# Patient Record
Sex: Female | Born: 1995 | Race: Black or African American | Hispanic: No | Marital: Married | Smoking: Current some day smoker
Health system: Southern US, Community
[De-identification: ages and names within clinical notes are randomized; demographics above are authoritative.]

## PROBLEM LIST (undated history)

## (undated) ENCOUNTER — Ambulatory Visit: Admission: EM | Payer: 59

## (undated) DIAGNOSIS — D649 Anemia, unspecified: Secondary | ICD-10-CM

---
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
## 2013-02-27 IMAGING — CR DG FEMUR 2V*L*
1 series · 4 of 4 positions shown · non-contrast
Comparison: none

REASON FOR EXAM: pain Dr. Nhi  at [facility] fax [phone]
COMMENTS:  Dr. [REDACTED] [phone]

PROCEDURE:     DXR - DXR FEMUR LEFT  - July 28, 2011  [DATE]
RESULT:
No acute bony or joint abnormality. There is no evidence of fracture.

[Series 1: t femur proximal ap left · 0.14mm/px · 4 of 4 slices shown]
[im 1/4]
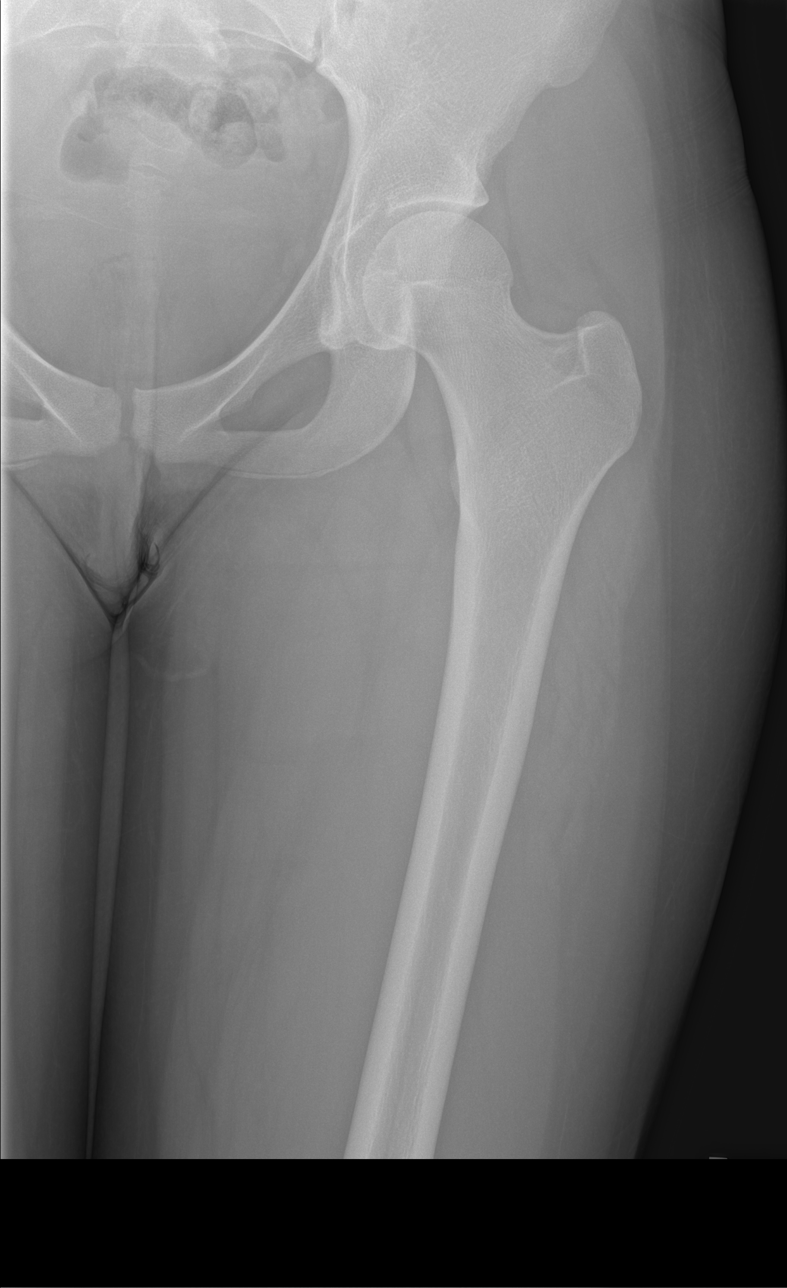
[im 2/4]
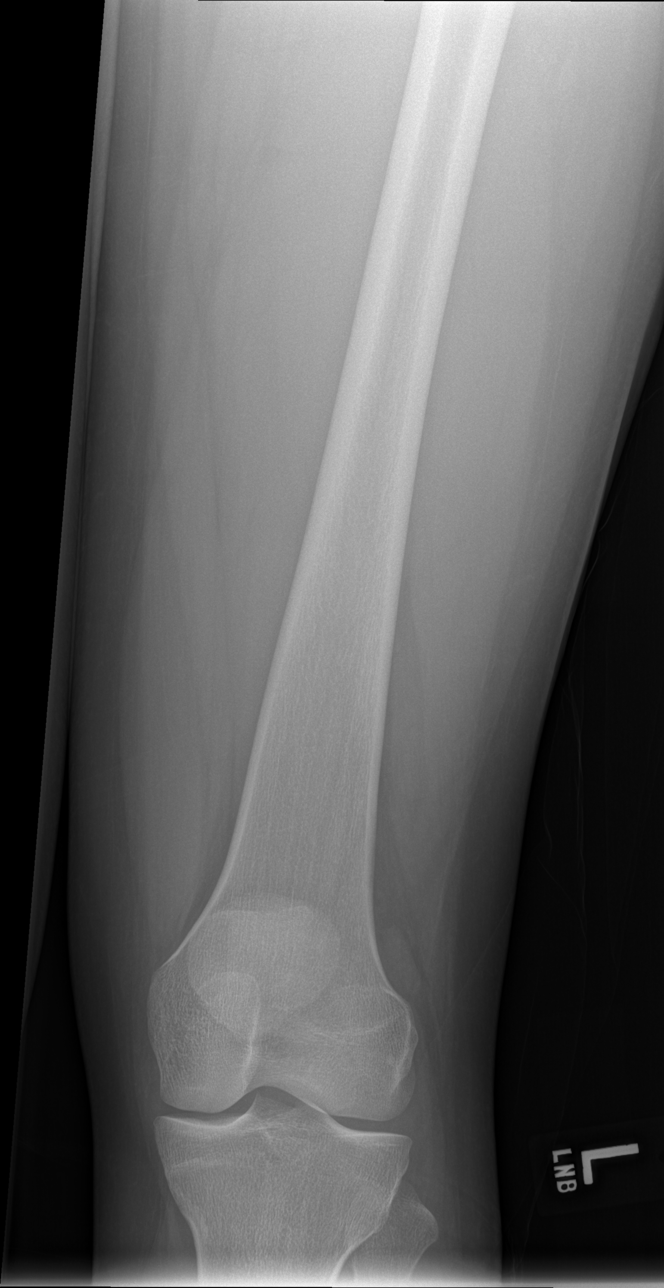
[im 3/4]
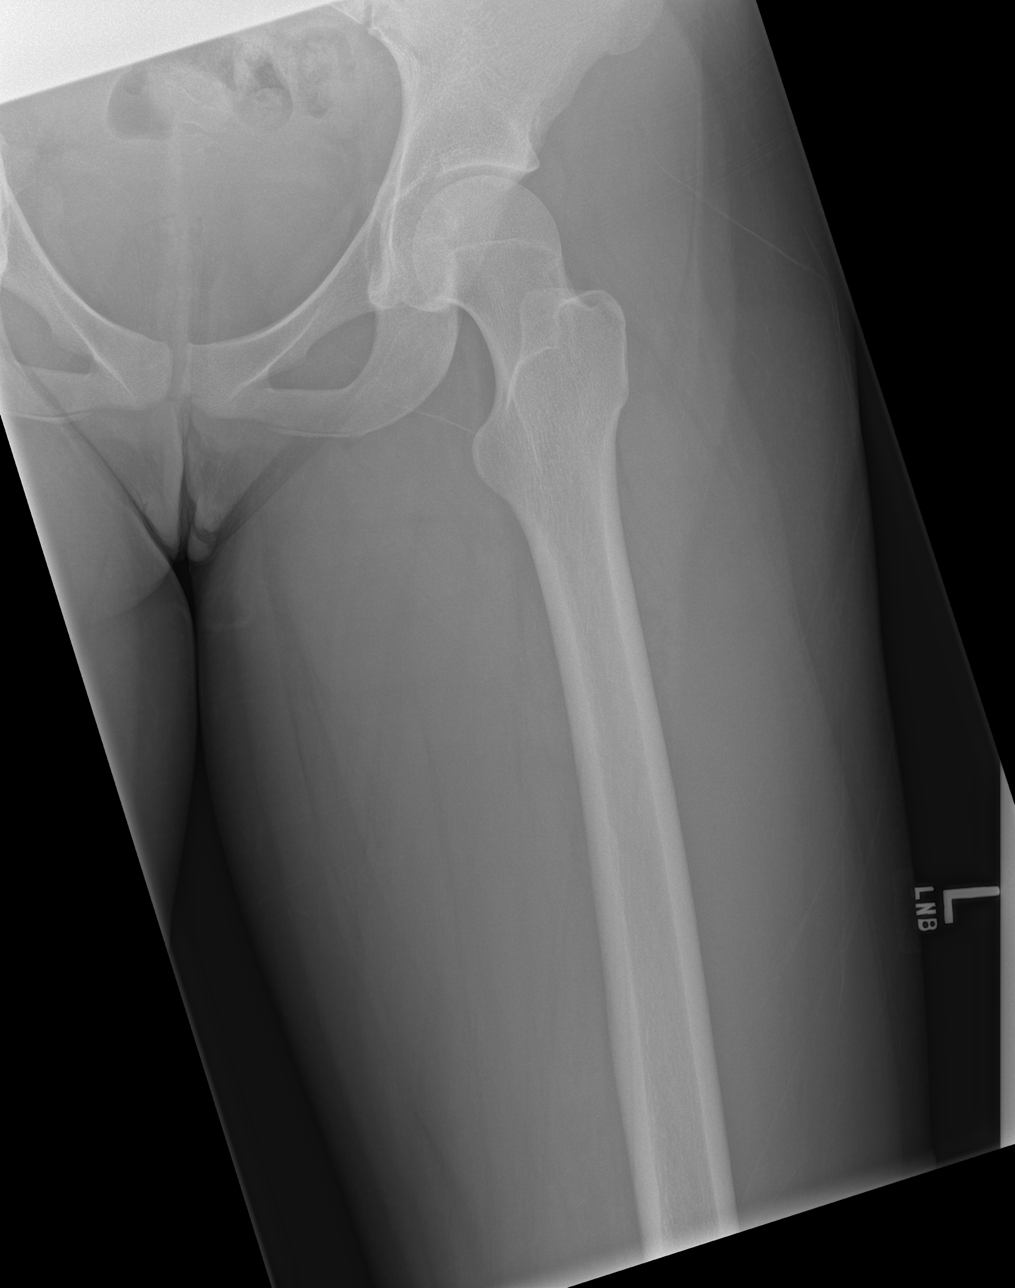
[im 4/4]
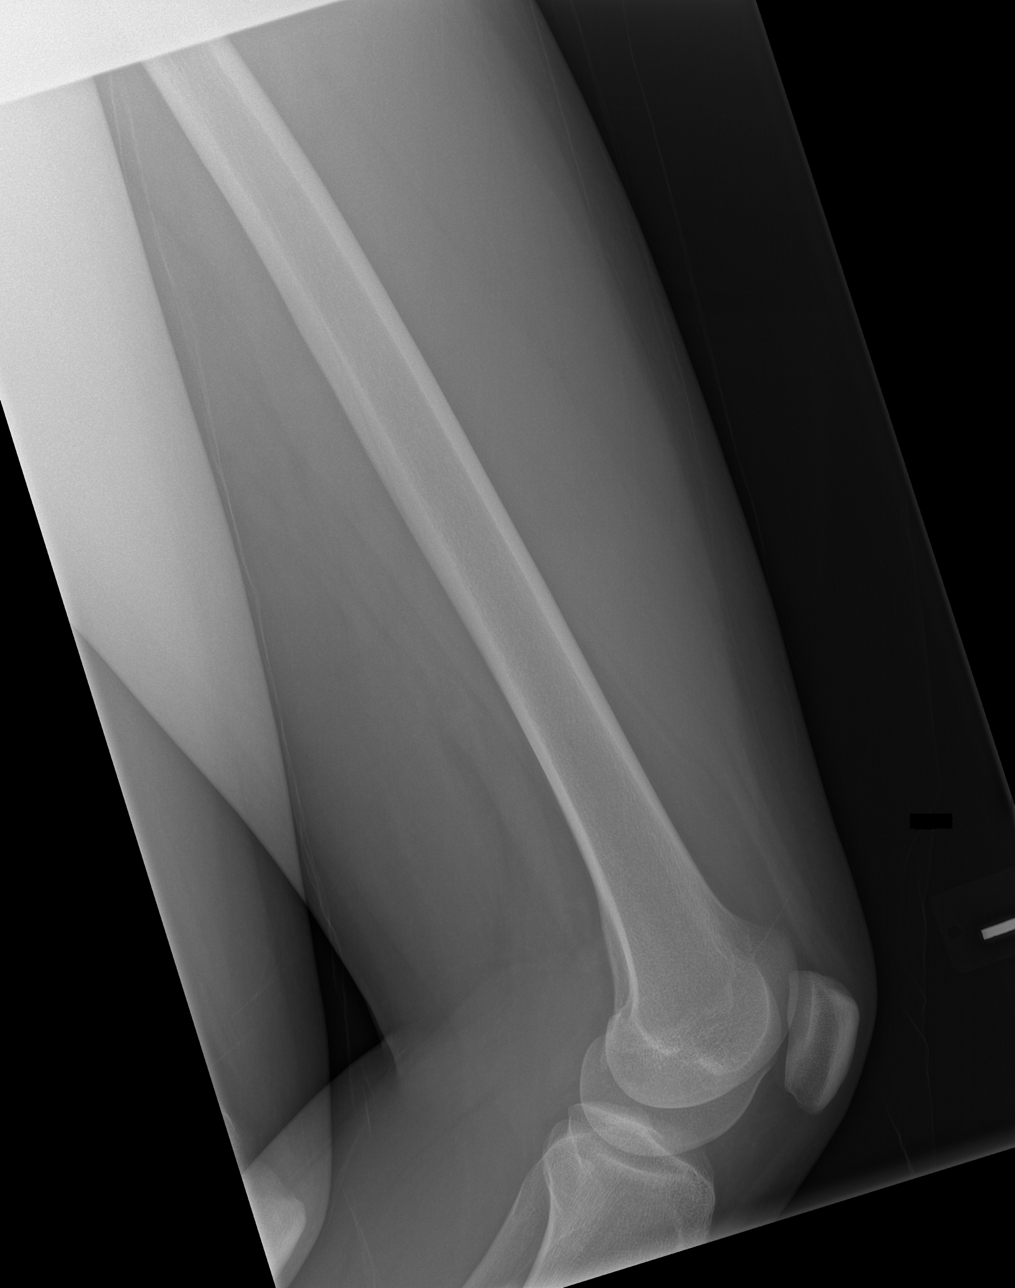

[4 of 4 positions shown; findings below may reference images not displayed]

IMPRESSION: No acute abnormality.

## 2015-05-27 ENCOUNTER — Emergency Department
Admission: EM | Admit: 2015-05-27 | Discharge: 2015-05-28 | Disposition: A | Discharge status: Home / Self Care | Payer: Medicaid Other | Attending: Emergency Medicine | Admitting: Emergency Medicine

## 2015-05-27 ENCOUNTER — Emergency Department: Payer: Medicaid Other

## 2015-05-27 ENCOUNTER — Encounter: Payer: Self-pay | Admitting: Emergency Medicine

## 2015-05-27 DIAGNOSIS — R112 Nausea with vomiting, unspecified: Secondary | ICD-10-CM | POA: Insufficient documentation

## 2015-05-27 DIAGNOSIS — R102 Pelvic and perineal pain: Secondary | ICD-10-CM | POA: Diagnosis not present

## 2015-05-27 DIAGNOSIS — K029 Dental caries, unspecified: Secondary | ICD-10-CM

## 2015-05-27 DIAGNOSIS — N739 Female pelvic inflammatory disease, unspecified: Secondary | ICD-10-CM

## 2015-05-27 LAB — WET PREP, GENITAL: Yeast Wet Prep HPF POC: NONE SEEN

## 2015-05-27 LAB — PROTIME-INR
INR: 1.13
Prothrombin Time: 14.7 seconds (ref 11.4–15.0)

## 2015-05-27 LAB — CBC WITH DIFFERENTIAL/PLATELET
Basophils Absolute: 0.1 10*3/uL (ref 0–0.1)
Basophils Relative: 1 %
EOS PCT: 3 %
Eosinophils Absolute: 0.4 10*3/uL (ref 0–0.7)
HCT: 39.7 % (ref 35.0–47.0)
Hemoglobin: 13.5 g/dL (ref 12.0–16.0)
LYMPHS ABS: 2.5 10*3/uL (ref 1.0–3.6)
LYMPHS PCT: 22 %
MCH: 31.3 pg (ref 26.0–34.0)
MCHC: 33.9 g/dL (ref 32.0–36.0)
MCV: 92.3 fL (ref 80.0–100.0)
MONO ABS: 1.3 10*3/uL — AB (ref 0.2–0.9)
Monocytes Relative: 11 %
Neutro Abs: 7.1 10*3/uL — ABNORMAL HIGH (ref 1.4–6.5)
Neutrophils Relative %: 63 %
PLATELETS: 326 10*3/uL (ref 150–440)
RBC: 4.3 MIL/uL (ref 3.80–5.20)
RDW: 12.8 % (ref 11.5–14.5)
WBC: 11.3 10*3/uL — ABNORMAL HIGH (ref 3.6–11.0)

## 2015-05-27 LAB — COMPREHENSIVE METABOLIC PANEL
ALT: 11 U/L — ABNORMAL LOW (ref 14–54)
AST: 20 U/L (ref 15–41)
Albumin: 4.7 g/dL (ref 3.5–5.0)
Alkaline Phosphatase: 54 U/L (ref 38–126)
Anion gap: 5 (ref 5–15)
BUN: 14 mg/dL (ref 6–20)
CHLORIDE: 105 mmol/L (ref 101–111)
CO2: 29 mmol/L (ref 22–32)
CREATININE: 0.92 mg/dL (ref 0.44–1.00)
Calcium: 9.3 mg/dL (ref 8.9–10.3)
Glucose, Bld: 94 mg/dL (ref 65–99)
POTASSIUM: 3.3 mmol/L — AB (ref 3.5–5.1)
Sodium: 139 mmol/L (ref 135–145)
Total Bilirubin: 0.8 mg/dL (ref 0.3–1.2)
Total Protein: 8.5 g/dL — ABNORMAL HIGH (ref 6.5–8.1)

## 2015-05-27 LAB — URINALYSIS COMPLETE WITH MICROSCOPIC (ARMC ONLY)
Bacteria, UA: NONE SEEN
Bilirubin Urine: NEGATIVE
GLUCOSE, UA: NEGATIVE mg/dL
KETONES UR: NEGATIVE mg/dL
NITRITE: NEGATIVE
Protein, ur: 30 mg/dL — AB
SPECIFIC GRAVITY, URINE: 1.033 — AB (ref 1.005–1.030)
pH: 5 (ref 5.0–8.0)

## 2015-05-27 LAB — CHLAMYDIA/NGC RT PCR (ARMC ONLY)
CHLAMYDIA TR: NOT DETECTED
N GONORRHOEAE: NOT DETECTED

## 2015-05-27 LAB — LACTIC ACID, PLASMA: LACTIC ACID, VENOUS: 1 mmol/L (ref 0.5–2.0)

## 2015-05-27 LAB — APTT: APTT: 33 s (ref 24–36)

## 2015-05-27 LAB — ACETAMINOPHEN LEVEL: ACETAMINOPHEN (TYLENOL), SERUM: 23 ug/mL (ref 10–30)

## 2015-05-27 LAB — POCT PREGNANCY, URINE: PREG TEST UR: NEGATIVE

## 2015-05-27 MED ORDER — OXYCODONE HCL 5 MG PO TABS
5.0000 mg | ORAL_TABLET | Freq: Once | ORAL | Status: AC
Start: 2015-05-27 — End: 2015-05-27
  Administered 2015-05-27: 5 mg via ORAL
  Filled 2015-05-27: qty 1

## 2015-05-27 MED ORDER — CEFTRIAXONE SODIUM 250 MG IJ SOLR
INTRAMUSCULAR | Status: AC
  Filled 2015-05-27: qty 250

## 2015-05-27 MED ORDER — CEFTRIAXONE SODIUM 250 MG IJ SOLR
250.0000 mg | Freq: Once | INTRAMUSCULAR | Status: AC
  Administered 2015-05-27: 250 mg via INTRAMUSCULAR

## 2015-05-27 MED ORDER — DOXYCYCLINE HYCLATE 100 MG PO TABS
ORAL_TABLET | ORAL | Status: AC
  Filled 2015-05-27: qty 1

## 2015-05-27 MED ORDER — DOXYCYCLINE HYCLATE 100 MG PO TABS: 100.0000 mg | ORAL_TABLET | Freq: Two times a day (BID) | ORAL | Status: DC

## 2015-05-27 MED ORDER — DOXYCYCLINE HYCLATE 100 MG PO TABS
100.0000 mg | ORAL_TABLET | Freq: Once | ORAL | Status: AC
  Administered 2015-05-27: 100 mg via ORAL
  Filled 2015-05-27: qty 1

## 2015-05-27 MED ORDER — METRONIDAZOLE 250 MG PO TABS
ORAL_TABLET | ORAL | Status: AC
  Filled 2015-05-27: qty 8

## 2015-05-27 MED ORDER — OXYCODONE-ACETAMINOPHEN 5-325 MG PO TABS: 1.0000 | ORAL_TABLET | Freq: Four times a day (QID) | ORAL | Status: DC | PRN

## 2015-05-27 MED ORDER — METRONIDAZOLE 500 MG PO TABS
2000.0000 mg | ORAL_TABLET | Freq: Once | ORAL | Status: AC
  Administered 2015-05-27: 2000 mg via ORAL

## 2015-05-27 NOTE — ED Provider Notes (Signed)
[facility] Provider Note  ____________________________________________  Time seen: Approximately 9 PM  I have reviewed the triage vital signs and the nursing notes.   HISTORY  Chief Complaint Vaginal Bleeding    HPI Janice Wood is a 19 y.o. female without any chronic medical problems was presenting today with vaginal bleeding over the past day. She says that she has had left lower molar pain and has been on the with a dentist tomorrow. However, she says that she has been taking multiple ibuprofen 200 mg tabs at a time and does not know how many she has taken a day. She also says that she has been taking 3 500 mg Tylenols at a time and also does not know how many Tylenol she has taken total. This morning she started having vaginal bleeding which she says is "running like water." She denies any clots. Says she has had some lower abdominal pain with the bleeding this morning but does not have any at this time. Says that she just finished her period last week and never has irregularities with her period.   History reviewed. No pertinent past medical history.  There are no active problems to display for this patient.   History reviewed. No pertinent past surgical history.  No current outpatient prescriptions on file.  Allergies Review of patient's allergies indicates no known allergies.  History reviewed. No pertinent family history.  Social History Social History  Substance Use Topics  . Smoking status: Never Smoker   . Smokeless tobacco: None  . Alcohol Use: No    Review of Systems Constitutional: No fever/chills Eyes: No visual changes. ENT: No sore throat. Cardiovascular: Denies chest pain. Respiratory: Denies shortness of breath. Gastrointestinal:   No nausea, no vomiting.  No diarrhea.  No constipation. Genitourinary: Negative for dysuria. Musculoskeletal: Negative for back pain. Skin: Negative for rash. Neurological:  Negative for headaches, focal weakness or numbness.  10-point ROS otherwise negative.  ____________________________________________   PHYSICAL EXAM:  VITAL SIGNS: ED Triage Vitals  Enc Vitals Group     BP 05/27/15 2042 118/78 mmHg     Pulse Rate 05/27/15 2042 72     Resp 05/27/15 2042 18     Temp 05/27/15 2042 98.7 F (37.1 C)     Temp Source 05/27/15 2042 Oral     SpO2 05/27/15 2042 97 %     Weight 05/27/15 2042 145 lb (65.772 kg)     Height 05/27/15 2042  (1.753 m)     Head Cir --      Peak Flow --      Pain Score --      Pain Loc --      Pain Edu? --      Excl. in GC? --     Constitutional: Alert and oriented. Well appearing and in no acute distress. Eyes: Conjunctivae are normal. PERRL. EOMI. Head: Atraumatic. Nose: No congestion/rhinnorhea. Mouth/Throat: Mucous membranes are moist.  Oropharynx non-erythematous. Left-sided posterior-most mandibular molar with cement overlying it. No swelling or pus visualized at the gumline. Neck: No stridor.   Cardiovascular: Normal rate, regular rhythm. Grossly normal heart sounds.  Good peripheral circulation. Respiratory: Normal respiratory effort.  No retractions. Lungs CTAB. Gastrointestinal: Soft with mild suprapubic abdominal pain just right of the midline.  No rebound or guarding. No tenderness over McBurney's point. No distention. No abdominal bruits. No CVA tenderness. Genitourinary: Normal external exam. Speculum exam with a small amount of blood in the vault. No active  bleeding from the cervix. No CMT. No uterine or left adnexal tenderness to palpation. Mild to moderate right adnexal tenderness to palpation. No masses palpated. Musculoskeletal: No lower extremity tenderness nor edema.  No joint effusions. Neurologic:  Normal speech and language. No gross focal neurologic deficits are appreciated. No gait instability. Skin:  Skin is warm, dry and intact. No rash noted. Psychiatric: Mood and affect are normal. Speech and  behavior are normal.  ____________________________________________   LABS (all labs ordered are listed, but only abnormal results are displayed)  Labs Reviewed  WET PREP, GENITAL - Abnormal; Notable for the following:    Trich, Wet Prep FEW (*)    Clue Cells Wet Prep HPF POC FEW (*)    WBC, Wet Prep HPF POC FEW (*)    All other components within normal limits  URINALYSIS COMPLETEWITH MICROSCOPIC ([facility] ONLY) - Abnormal; Notable for the following:    Color, Urine YELLOW (*)    APPearance CLEAR (*)    Specific Gravity, Urine 1.033 (*)    Hgb urine dipstick 2+ (*)    Protein, ur 30 (*)    Leukocytes, UA TRACE (*)    Squamous Epithelial / LPF 0-5 (*)    All other components within normal limits  CBC WITH DIFFERENTIAL/PLATELET - Abnormal; Notable for the following:    WBC 11.3 (*)    Neutro Abs 7.1 (*)    Monocytes Absolute 1.3 (*)    All other components within normal limits  COMPREHENSIVE METABOLIC PANEL - Abnormal; Notable for the following:    Potassium 3.3 (*)    Total Protein 8.5 (*)    ALT 11 (*)    All other components within normal limits  CHLAMYDIA/NGC RT PCR ([facility] ONLY)  ACETAMINOPHEN LEVEL  LACTIC ACID, PLASMA  PROTIME-INR  APTT  LACTIC ACID, PLASMA  POC URINE PREG, ED  POCT PREGNANCY, URINE   ____________________________________________  EKG   ____________________________________________  RADIOLOGY  Normal pelvic ultrasound. ____________________________________________   PROCEDURES  ____________________________________________   INITIAL IMPRESSION / ASSESSMENT AND PLAN / ED COURSE  Pertinent labs & imaging results that were available during my care of the patient were reviewed by me and considered in my medical decision making (see chart for details).  ----------------------------------------- 11:17 PM on 05/27/2015 -----------------------------------------  Patient resting comfortable at this time. Discussed results of her lab  workup as well as imaging results and understands that she has been diagnosed with a sexually transmitted disease must not have any sexual activity until cleared by her primary care doctor for further testing for other STDs including HIV, chlamydia and herpes. I discussed with her that her partner must also abstain from sexual intercourse and to follow-up for further treatment and testing. The patient understands the plan and is one to comply. She also has follow-up tomorrow morning for her dental pain. Does not appear to have any systemic effects from her excess pain medication intake. I did counsel her that she must only take over-the-counter pain medications as prescribed. She understands the plan is one to comply. No tenderness over McBurney's point persistently in also persistently without any nausea, vomiting or rebound or guarding on exam. ____________________________________________   FINAL CLINICAL IMPRESSION(S) / ED DIAGNOSES  Acute pelvic inflammatory disease. Initial visit.    Myrna Blazer, MD 05/27/15 [phone]

## 2015-05-27 NOTE — ED Notes (Signed)
Pt presents to ED vaginal bleeding that has continued since her period last week in which she has taken several NSAIDS due to dental pain.

## 2015-05-27 NOTE — ED Notes (Signed)
Pt has low abd pain with cramping and vag bleeding.  Denies dysuria.  Sx started today   Pt also reports a toothache.  Using oragel without relief

## 2015-05-27 NOTE — Discharge Instructions (Signed)
He does not have any sexual activity until both you and your partner treated for sexually transmitted diseases and cleared to resume sexual activity by her primary care providers. Discuss further testing for other STDs with your primary care doctor including HIV, syphilis and herpes. Pelvic Inflammatory Disease Pelvic inflammatory disease (PID) refers to an infection in some or all of the female organs. The infection can be in the uterus, ovaries, fallopian tubes, or the surrounding tissues in the pelvis. PID can cause abdominal or pelvic pain that comes on suddenly (acute pelvic pain). PID is a serious infection because it can lead to lasting (chronic) pelvic pain or the inability to have children (infertility). CAUSES This condition is most often caused by an infection that is spread during sexual contact. However, the infection can also be caused by the normal bacteria that are found in the vaginal tissues if these bacteria travel upward into the reproductive organs. PID can also occur following:  The birth of a baby.  A miscarriage.  An abortion.  Major pelvic surgery.  The use of an intrauterine device (IUD).  A sexual assault. RISK FACTORS This condition is more likely to develop in women who:  Are younger than 21 years of age.  Are sexually active at [facility] age.  Use nonbarrier contraception.  Have multiple sexual partners.  Have sex with someone who has symptoms of an STD (sexually transmitted disease).  Use oral contraception. At times, certain behaviors can also increase the possibility of getting PID, such as:  Using a vaginal douche.  Having an IUD in place. SYMPTOMS Symptoms of this condition include:  Abdominal or pelvic pain.  Fever.  Chills.  Abnormal vaginal discharge.  Abnormal uterine bleeding.  Unusual pain shortly after the end of a menstrual period.  Painful urination.  Pain with sexual intercourse.  Nausea and vomiting. DIAGNOSIS To  diagnose this condition, your health care provider will do a physical exam and take your medical history. A pelvic exam typically reveals great tenderness in the uterus and the surrounding pelvic tissues. You may also have tests, such as:  Lab tests, including a pregnancy test, blood tests, and urine test.  Culture tests of the vagina and cervix to check for an STD.  Ultrasound.  A laparoscopic procedure to look inside the pelvis.  Examining vaginal secretions under a microscope. TREATMENT Treatment for this condition may involve one or more approaches.  Antibiotic medicines may be prescribed to be taken by mouth.  Sexual partners may need to be treated if the infection is caused by an STD.  For more severe cases, hospitalization may be needed to give antibiotics directly into a vein through an IV tube.  Surgery may be needed if other treatments do not help, but this is rare. It may take weeks until you are completely well. If you are diagnosed with PID, you should also be checked for human immunodeficiency virus (HIV). Your health care provider may test you for infection again 3 months after treatment. You should not have unprotected sex. HOME CARE INSTRUCTIONS  Take over-the-counter and prescription medicines only as told by your health care provider.  If you were prescribed an antibiotic medicine, take it as told by your health care provider. Do not stop taking the antibiotic even if you start to feel better.  Do not have sexual intercourse until treatment is completed or as told by your health care provider. If PID is confirmed, your recent sexual partners will need treatment, especially if you had  unprotected sex.  Keep all follow-up visits as told by your health care provider. This is important. SEEK MEDICAL CARE IF:  You have increased or abnormal vaginal discharge.  Your pain does not improve.  You vomit.  You have a fever.  You cannot tolerate your medicines.  Your  partner has an STD.  You have pain when you urinate. SEEK IMMEDIATE MEDICAL CARE IF:  You have increased abdominal or pelvic pain.  You have chills.  Your symptoms are not better in 72 hours even with treatment.   This information is not intended to replace advice given to you by your health care provider. Make sure you discuss any questions you have with your health care provider.   Document Released: 08/02/2005 Document Revised: 04/23/2015 Document Reviewed: 09/09/2014 Elsevier Interactive Patient Education 2016 [employer]  Dental Caries Dental caries (also called tooth decay) is the most common oral disease. It can occur at any age but is more common in children and young adults.  HOW DENTAL CARIES DEVELOPS  The process of decay begins when bacteria and foods (particularly sugars and starches) combine in your mouth to produce plaque. Plaque is a substance that sticks to the hard, outer surface of a tooth (enamel). The bacteria in plaque produce acids that attack enamel. These acids may also attack the root surface of a tooth (cementum) if it is exposed. Repeated attacks dissolve these surfaces and create holes in the tooth (cavities). If left untreated, the acids destroy the other layers of the tooth.  RISK FACTORS  Frequent sipping of sugary beverages.   Frequent snacking on sugary and starchy foods, especially those that easily get stuck in the teeth.   Poor oral hygiene.   Dry mouth.   Substance abuse such as methamphetamine abuse.   Broken or poor-fitting dental restorations.   Eating disorders.   Gastroesophageal reflux disease (GERD).   Certain radiation treatments to the head and neck. SYMPTOMS In the early stages of dental caries, symptoms are seldom present. Sometimes white, chalky areas may be seen on the enamel or other tooth layers. In later stages, symptoms may include:  Pits and holes on the enamel.  Toothache after sweet, hot, or cold foods or  drinks are consumed.  Pain around the tooth.  Swelling around the tooth. DIAGNOSIS  Most of the time, dental caries is detected during a regular dental checkup. A diagnosis is made after a thorough medical and dental history is taken and the surfaces of your teeth are checked for signs of dental caries. Sometimes special instruments, such as lasers, are used to check for dental caries. Dental X-ray exams may be taken so that areas not visible to the eye (such as between the contact areas of the teeth) can be checked for cavities.  TREATMENT  If dental caries is in its early stages, it may be reversed with a fluoride treatment or an application of a remineralizing agent at the dental office. Thorough brushing and flossing at home is needed to aid these treatments. If it is in its later stages, treatment depends on the location and extent of tooth destruction:   If a small area of the tooth has been destroyed, the destroyed area will be removed and cavities will be filled with a material such as gold, silver amalgam, or composite resin.   If a large area of the tooth has been destroyed, the destroyed area will be removed and a cap (crown) will be fitted over the remaining tooth structure.  If the center part of the tooth (pulp) is affected, a procedure called a root canal will be needed before a filling or crown can be placed.   If most of the tooth has been destroyed, the tooth may need to be pulled (extracted). HOME CARE INSTRUCTIONS You can prevent, stop, or reverse dental caries at home by practicing good oral hygiene. Good oral hygiene includes:  Thoroughly cleaning your teeth at least twice a day with a toothbrush and dental floss.   Using a fluoride toothpaste. A fluoride mouth rinse may also be used if recommended by your dentist or health care provider.   Restricting the amount of sugary and starchy foods and sugary liquids you consume.   Avoiding frequent snacking on these  foods and sipping of these liquids.   Keeping regular visits with a dentist for checkups and cleanings. PREVENTION   Practice good oral hygiene.  Consider a dental sealant. A dental sealant is a coating material that is applied by your dentist to the pits and grooves of teeth. The sealant prevents food from being trapped in them. It may protect the teeth for several years.  Ask about fluoride supplements if you live in a community without fluorinated water or with water that has a low fluoride content. Use fluoride supplements as directed by your dentist or health care provider.  Allow fluoride varnish applications to teeth if directed by your dentist or health care provider.   This information is not intended to replace advice given to you by your health care provider. Make sure you discuss any questions you have with your health care provider.   Document Released: 04/24/2002 Document Revised: 08/23/2014 Document Reviewed: 08/04/2012 Elsevier Interactive Patient Education [employer]

## 2015-05-28 ENCOUNTER — Encounter: Payer: Self-pay | Admitting: *Deleted

## 2015-05-28 ENCOUNTER — Emergency Department
Admission: EM | Admit: 2015-05-28 | Discharge: 2015-05-28 | Disposition: A | Discharge status: Home / Self Care | Payer: Medicaid Other | Source: Home / Self Care | Attending: Emergency Medicine | Admitting: Emergency Medicine

## 2015-05-28 DIAGNOSIS — R112 Nausea with vomiting, unspecified: Secondary | ICD-10-CM

## 2015-05-28 MED ORDER — ONDANSETRON 4 MG PO TBDP
8.0000 mg | ORAL_TABLET | Freq: Once | ORAL | Status: AC
Start: 2015-05-28 — End: 2015-05-28
  Administered 2015-05-28: 8 mg via ORAL

## 2015-05-28 MED ORDER — ONDANSETRON 4 MG PO TBDP
ORAL_TABLET | ORAL | Status: AC
  Filled 2015-05-28: qty 2

## 2015-05-28 MED ORDER — ONDANSETRON HCL 4 MG PO TABS: 4.0000 mg | ORAL_TABLET | Freq: Every day | ORAL | Status: DC | PRN

## 2015-05-28 MED ORDER — METRONIDAZOLE 500 MG PO TABS: 500.0000 mg | ORAL_TABLET | Freq: Two times a day (BID) | ORAL | Status: DC

## 2015-05-28 NOTE — ED Provider Notes (Signed)
[facility] Emergency Department Provider Note  ____________________________________________  Time seen: 2126  I have reviewed the triage vital signs and the nursing notes.   HISTORY  Chief Complaint Emesis     HPI Janice Wood is a 19 y.o. female who was seen in the emergency department yesterday. She had pelvic pain and a toothache. She had an ultrasound done and was diagnosed with Trichomonas by wet prep. She had some adnexal tenderness but no cervical motion tenderness, as noted on Dr. Ardell Isaacs documentation. She presents today due to nausea and emesis today. She threw up once yesterday and 2 or 3 times today. She arrived with ongoing nausea. I ordered Zofran for her and upon my examination she reports her nausea has resolved and she feels better.  Due to the concerns for Trichomonas and possible STD, she was treated with 2 g of the metronidazole yesterday. She was also prescribed doxycycline for possible pelvic infection and Percocet for pain.  She did see a dentist today. The dentist also prescribed amoxicillin for her.     No past medical history on file.  There are no active problems to display for this patient.   No past surgical history on file.  Current Outpatient Rx  Name  Route  Sig  Dispense  Refill  . doxycycline (VIBRA-TABS) 100 MG tablet   Oral   Take 1 tablet (100 mg total) by mouth 2 (two) times daily.   28 tablet   0   . metroNIDAZOLE (FLAGYL) 500 MG tablet   Oral   Take 1 tablet (500 mg total) by mouth 2 (two) times daily.   14 tablet   0   . ondansetron (ZOFRAN) 4 MG tablet   Oral   Take 1 tablet (4 mg total) by mouth daily as needed for nausea or vomiting.   10 tablet   0   . oxyCODONE-acetaminophen (ROXICET) 5-325 MG tablet   Oral   Take 1 tablet by mouth every 6 (six) hours as needed for severe pain.   10 tablet   0     Allergies Review of patient's allergies indicates no known allergies.  No family  history on file.  Social History Social History  Substance Use Topics  . Smoking status: Never Smoker   . Smokeless tobacco: None  . Alcohol Use: No    Review of Systems  Constitutional: Negative for fever. ENT: Negative for sore throat. Cardiovascular: Negative for chest pain. Respiratory: Negative for cough. Gastrointestinal: Nausea and vomiting today status post treatment. See history of present illness Genitourinary: Pelvic discomfort yesterday. See history of present illness and documentation from yesterday.. Musculoskeletal: No myalgias or injuries. Skin: Negative for rash. Neurological: Negative for paresthesia or weakness   10-point ROS otherwise negative.  ____________________________________________   PHYSICAL EXAM:  VITAL SIGNS: ED Triage Vitals  Enc Vitals Group     BP 05/28/15 2037 142/109 mmHg     Pulse Rate 05/28/15 2037 111     Resp 05/28/15 2037 22     Temp 05/28/15 2037 99.5 F (37.5 C)     Temp Source 05/28/15 2037 Oral     SpO2 05/28/15 2037 99 %     Weight 05/28/15 2037 145 lb (65.772 kg)     Height 05/28/15 2037  (1.727 m)     Head Cir --      Peak Flow --      Pain Score --      Pain Loc --  Pain Edu? --      Excl. in GC? --     Constitutional: Alert and oriented. Well appearing and in no distress. ENT   Head: Normocephalic and atraumatic.   Nose: No congestion/rhinnorhea.    Mouth: Patient does have mild discomfort on her back lower left tooth status post caries and dental visit. Cardiovascular: Normal rate, regular rhythm, no murmur noted Respiratory:  Normal respiratory effort, no tachypnea.    Breath sounds are clear and equal bilaterally.  Gastrointestinal: Soft and nontender. No distention.  Back: No muscle spasm, no tenderness, no CVA tenderness. Musculoskeletal: No deformity noted. Nontender with normal range of motion in all extremities.  No noted edema. Neurologic:  Normal speech and language. No gross focal  neurologic deficits are appreciated.  Skin:  Skin is warm, dry. No rash noted. Psychiatric: Mood and affect are normal. Speech and behavior are normal.  ____________________________________________    INITIAL IMPRESSION / ASSESSMENT AND PLAN / ED COURSE  Pertinent labs & imaging results that were available during my care of the patient were reviewed by me and considered in my medical decision making (see chart for details).  Well-appearing [age] oh female in no acute distress at this time. Her nausea is resolved status post Zofran. I believe the nausea is likely due to the large amount of metronidazole she was treated with yesterday. She is concerned that when she threw up yesterday she lost some of this medication. We will prescribe metronidazole, 5 mg, twice a day, for the next 6 days. I will also prescribe Zofran in case of ongoing nausea, although I think at this normal lower dose she will be much less likely to have a problem. Her chlamydia and gonorrhea tests were negative. With no cervical motion tenderness noted yesterday and negative testing, we will discontinue the doxycycline. I instructed her to continue the amoxicillin as prescribed by her dentist.  ____________________________________________   FINAL CLINICAL IMPRESSION(S) / ED DIAGNOSES  Final diagnoses:  Non-intractable vomiting with nausea, vomiting of unspecified type      Darien Ramusavid W Lary Eckardt, MD 05/28/15 2210

## 2015-05-28 NOTE — Discharge Instructions (Signed)

## 2015-05-28 NOTE — ED Notes (Signed)
Pt was discharged from the ED yesterday at 2343, and was moved off the floor

## 2015-05-28 NOTE — ED Notes (Signed)
Pt treated in er yesterday for trich and dental pain.  Today, pt has vomiting.  Pt vomiting in triage.

## 2015-10-03 ENCOUNTER — Emergency Department
Admission: EM | Admit: 2015-10-03 | Discharge: 2015-10-03 | Disposition: A | Discharge status: Home / Self Care | Payer: Self-pay | Attending: Emergency Medicine | Admitting: Emergency Medicine

## 2015-10-03 ENCOUNTER — Encounter: Payer: Self-pay | Admitting: Emergency Medicine

## 2015-10-03 ENCOUNTER — Emergency Department: Payer: Self-pay

## 2015-10-03 DIAGNOSIS — Z792 Long term (current) use of antibiotics: Secondary | ICD-10-CM | POA: Insufficient documentation

## 2015-10-03 DIAGNOSIS — R58 Hemorrhage, not elsewhere classified: Secondary | ICD-10-CM

## 2015-10-03 DIAGNOSIS — N939 Abnormal uterine and vaginal bleeding, unspecified: Secondary | ICD-10-CM

## 2015-10-03 DIAGNOSIS — O2341 Unspecified infection of urinary tract in pregnancy, first trimester: Secondary | ICD-10-CM | POA: Insufficient documentation

## 2015-10-03 DIAGNOSIS — O209 Hemorrhage in early pregnancy, unspecified: Secondary | ICD-10-CM | POA: Insufficient documentation

## 2015-10-03 DIAGNOSIS — Z3A10 10 weeks gestation of pregnancy: Secondary | ICD-10-CM | POA: Insufficient documentation

## 2015-10-03 LAB — URINALYSIS COMPLETE WITH MICROSCOPIC (ARMC ONLY)
Bilirubin Urine: NEGATIVE
Glucose, UA: NEGATIVE mg/dL
KETONES UR: NEGATIVE mg/dL
LEUKOCYTES UA: NEGATIVE
NITRITE: POSITIVE — AB
PH: 6 (ref 5.0–8.0)
PROTEIN: NEGATIVE mg/dL
SPECIFIC GRAVITY, URINE: 1.023 (ref 1.005–1.030)

## 2015-10-03 LAB — CBC WITH DIFFERENTIAL/PLATELET
BASOS ABS: 0 10*3/uL (ref 0–0.1)
Eosinophils Absolute: 0.3 10*3/uL (ref 0–0.7)
HCT: 40.4 % (ref 35.0–47.0)
Hemoglobin: 13.9 g/dL (ref 12.0–16.0)
Lymphs Abs: 2.9 10*3/uL (ref 1.0–3.6)
MCH: 31.2 pg (ref 26.0–34.0)
MCHC: 34.5 g/dL (ref 32.0–36.0)
MCV: 90.5 fL (ref 80.0–100.0)
MONO ABS: 0.7 10*3/uL (ref 0.2–0.9)
Neutro Abs: 4.1 10*3/uL (ref 1.4–6.5)
Neutrophils Relative %: 52 %
PLATELETS: 325 10*3/uL (ref 150–440)
RBC: 4.47 MIL/uL (ref 3.80–5.20)
RDW: 13.1 % (ref 11.5–14.5)
WBC: 8.1 10*3/uL (ref 3.6–11.0)

## 2015-10-03 LAB — HCG, QUANTITATIVE, PREGNANCY: HCG, BETA CHAIN, QUANT, S: 57219 m[IU]/mL — AB (ref <5)

## 2015-10-03 LAB — BASIC METABOLIC PANEL
Anion gap: 11 (ref 5–15)
BUN: 6 mg/dL (ref 6–20)
CALCIUM: 9.2 mg/dL (ref 8.9–10.3)
CO2: 19 mmol/L — ABNORMAL LOW (ref 22–32)
Chloride: 108 mmol/L (ref 101–111)
Creatinine, Ser: 0.52 mg/dL (ref 0.44–1.00)
GFR calc Af Amer: >60 mL/min (ref >60)
GLUCOSE: 88 mg/dL (ref 65–99)
Potassium: 3.6 mmol/L (ref 3.5–5.1)
Sodium: 138 mmol/L (ref 135–145)

## 2015-10-03 LAB — ABO/RH: ABO/RH(D): O POS

## 2015-10-03 MED ORDER — NITROFURANTOIN MONOHYD MACRO 100 MG PO CAPS: 100.0000 mg | ORAL_CAPSULE | Freq: Two times a day (BID) | ORAL | Status: DC

## 2015-10-03 MED ORDER — NITROFURANTOIN MONOHYD MACRO 100 MG PO CAPS
100.0000 mg | ORAL_CAPSULE | Freq: Once | ORAL | Status: AC
  Administered 2015-10-03: 100 mg via ORAL
  Filled 2015-10-03: qty 1

## 2015-10-03 NOTE — Discharge Instructions (Signed)
Return to the emergency room for any worsening condition including heavy bleeding, dizziness or passing out, or abdominal pain.   Vaginal Bleeding During Pregnancy, First Trimester A small amount of bleeding (spotting) from the vagina is relatively common in early pregnancy. It usually stops on its own. Various things may cause bleeding or spotting in early pregnancy. Some bleeding may be related to the pregnancy, and some may not. In most cases, the bleeding is normal and is not a problem. However, bleeding can also be a sign of something serious. Be sure to tell your health care provider about any vaginal bleeding right away. Some possible causes of vaginal bleeding during the first trimester include:  Infection or inflammation of the cervix.  Growths (polyps) on the cervix.  Miscarriage or threatened miscarriage.  Pregnancy tissue has developed outside of the uterus and in a fallopian tube (tubal pregnancy).  Tiny cysts have developed in the uterus instead of pregnancy tissue (molar pregnancy). HOME CARE INSTRUCTIONS  Watch your condition for any changes. The following actions may help to lessen any discomfort you are feeling:  Follow your health care provider's instructions for limiting your activity. If your health care provider orders bed rest, you may need to stay in bed and only get up to use the bathroom. However, your health care provider may allow you to continue light activity.  If needed, make plans for someone to help with your regular activities and responsibilities while you are on bed rest.  Keep track of the number of pads you use each day, how often you change pads, and how soaked (saturated) they are. Write this down.  Do not use tampons. Do not douche.  Do not have sexual intercourse or orgasms until approved by your health care provider.  If you pass any tissue from your vagina, save the tissue so you can show it to your health care provider.  Only take  over-the-counter or prescription medicines as directed by your health care provider.  Do not take aspirin because it can make you bleed.  Keep all follow-up appointments as directed by your health care provider. SEEK MEDICAL CARE IF:  You have any vaginal bleeding during any part of your pregnancy.  You have cramps or labor pains.  You have a fever, not controlled by medicine. SEEK IMMEDIATE MEDICAL CARE IF:   You have severe cramps in your back or belly (abdomen).  You pass large clots or tissue from your vagina.  Your bleeding increases.  You feel light-headed or weak, or you have fainting episodes.  You have chills.  You are leaking fluid or have a gush of fluid from your vagina.  You pass out while having a bowel movement. MAKE SURE YOU:  Understand these instructions.  Will watch your condition.  Will get help right away if you are not doing well or get worse.   This information is not intended to replace advice given to you by your health care provider. Make sure you discuss any questions you have with your health care provider.   Document Released: 05/12/2005 Document Revised: 08/07/2013 Document Reviewed: 04/09/2013 Elsevier Interactive Patient Education [employer]

## 2015-10-03 NOTE — ED Notes (Signed)
States she had a positive home preg test  Developed some vaginal bleeding today

## 2015-10-03 NOTE — ED Notes (Signed)
Pt reports she is 2 months pregnant  And started having medium amounts of bleeding since yesterday, no cramping or pain present.

## 2015-10-03 NOTE — ED Notes (Signed)
Patient transported to Ultrasound 

## 2015-10-03 NOTE — ED Provider Notes (Signed)
[facility] Emergency Department Provider Note   ____________________________________________  Time seen:  I have reviewed the triage vital signs and the triage nursing note.  HISTORY  Chief Complaint Vaginal Bleeding   Historian Patient  HPI Janice Wood is a 20 y.o. female who is G3 P0 A2here approximately 2 months pregnant, reporting vaginal bleeding today after intercourse. It was a moderate amount but is now resolved. She did have a small amount of pink spotting week ago with intercourse.  No abdominal pain. No back pain. No urinary symptoms. Symptoms are mild.    History reviewed. No pertinent past medical history.  There are no active problems to display for this patient.   History reviewed. No pertinent past surgical history.  Current Outpatient Rx  Name  Route  Sig  Dispense  Refill  . doxycycline (VIBRA-TABS) 100 MG tablet   Oral   Take 1 tablet (100 mg total) by mouth 2 (two) times daily.   28 tablet   0   . metroNIDAZOLE (FLAGYL) 500 MG tablet   Oral   Take 1 tablet (500 mg total) by mouth 2 (two) times daily.   14 tablet   0   . ondansetron (ZOFRAN) 4 MG tablet   Oral   Take 1 tablet (4 mg total) by mouth daily as needed for nausea or vomiting.   10 tablet   0   . oxyCODONE-acetaminophen (ROXICET) 5-325 MG tablet   Oral   Take 1 tablet by mouth every 6 (six) hours as needed for severe pain.   10 tablet   0     Allergies Review of patient's allergies indicates no known allergies.  No family history on file.  Social History Social History  Substance Use Topics  . Smoking status: Never Smoker   . Smokeless tobacco: None  . Alcohol Use: No    Review of Systems  Constitutional: Negative for fever. Eyes: Negative for visual changes. ENT: Negative for sore throat. Cardiovascular: Negative for chest pain. Respiratory: Negative for shortness of breath. Gastrointestinal: Negative for abdominal pain, vomiting and  diarrhea. Genitourinary: Negative for dysuria. Musculoskeletal: Negative for back pain. Skin: Negative for rash. Neurological: Negative for headache. 10 point Review of Systems otherwise negative ____________________________________________   PHYSICAL EXAM:  VITAL SIGNS: ED Triage Vitals  Enc Vitals Group     BP 10/03/15 1624 114/66 mmHg     Pulse Rate 10/03/15 1624 87     Resp 10/03/15 1624 20     Temp 10/03/15 1624 98.6 F (37 C)     Temp Source 10/03/15 1624 Oral     SpO2 10/03/15 1624 98 %     Weight 10/03/15 1624 145 lb (65.772 kg)     Height 10/03/15 1624  (1.727 m)     Head Cir --      Peak Flow --      Pain Score --      Pain Loc --      Pain Edu? --      Excl. in GC? --      Constitutional: Alert and oriented. Well appearing and in no distress. HEENT   Head: Normocephalic and atraumatic.      Eyes: Conjunctivae are normal. PERRL. Normal extraocular movements.      Ears:         Nose: No congestion/rhinnorhea.   Mouth/Throat: Mucous membranes are moist.   Neck: No stridor. Cardiovascular/Chest: Normal rate, regular rhythm.  No murmurs, rubs, or gallops. Respiratory: Normal  respiratory effort without tachypnea nor retractions. Breath sounds are clear and equal bilaterally. No wheezes/rales/rhonchi. Gastrointestinal: Soft. No distention, no guarding, no rebound. Nontender.    Genitourinary/rectal: Small amount of pinkish discharge. No cervical motion tenderness. No supple. For adnexal tenderness. Musculoskeletal: Nontender with normal range of motion in all extremities. No joint effusions.  No lower extremity tenderness.  No edema. Neurologic:  Normal speech and language. No gross or focal neurologic deficits are appreciated. Skin:  Skin is warm, dry and intact. No rash noted. Psychiatric: Mood and affect are normal. Speech and behavior are normal. Patient exhibits appropriate insight and judgment.  ____________________________________________    EKG I, Governor Rooks, MD, the attending physician have personally viewed and interpreted all ECGs.  None ____________________________________________  LABS (pertinent positives/negatives)  Urinalysis nitrite positive, many bacteria, 0-5 white blood cells HCG 57219 Basic metabolic panel within normal limits CBC within normal limits Blood type O+ Gonorrhea and Chlamydia depending_ ___________________________________________  RADIOLOGY All Xrays were viewed by me. Imaging interpreted by Radiologist.  Ultrasound pelvic:  IMPRESSION: Single live intrauterine pregnancy with an estimated gestational age of [redacted] weeks, 2 days. __________________________________________  PROCEDURES  Procedure(s) performed: None  Critical Care performed: None  ____________________________________________   ED COURSE / ASSESSMENT AND PLAN  Pertinent labs & imaging results that were available during my care of the patient were reviewed by me and considered in my medical decision making (see chart for details).  First trimester bleeding, which is now stopped. Urinalysis positive for urinary tract infection, and patient was placed on Macrobid.  I don't have suspicion for PID or cervicitis clinically.  Ultrasound reassuring. Discussed with the patient. Referred to OB/GYN follow-up. First trimester bleeding precautions discussed.    CONSULTATIONS:   None  Patient / Family / Caregiver informed of clinical course, medical decision-making process, and agree with plan.   I discussed return precautions, follow-up instructions, and discharged instructions with patient and/or family.   ___________________________________________   FINAL CLINICAL IMPRESSION(S) / ED DIAGNOSES   Final diagnoses:  Vaginal bleeding  First trimester bleeding  Bleeding              Note: This dictation was prepared with Dragon dictation. Any transcriptional errors that result from this process are  unintentional   Governor Rooks, MD 10/03/15 2246

## 2015-10-05 LAB — URINE CULTURE

## 2015-10-22 ENCOUNTER — Emergency Department: Payer: Self-pay

## 2015-10-22 ENCOUNTER — Emergency Department
Admission: EM | Admit: 2015-10-22 | Discharge: 2015-10-23 | Disposition: A | Discharge status: Home / Self Care | Payer: Self-pay | Attending: Emergency Medicine | Admitting: Emergency Medicine

## 2015-10-22 DIAGNOSIS — O034 Incomplete spontaneous abortion without complication: Secondary | ICD-10-CM | POA: Insufficient documentation

## 2015-10-22 DIAGNOSIS — O039 Complete or unspecified spontaneous abortion without complication: Secondary | ICD-10-CM

## 2015-10-22 DIAGNOSIS — R000 Tachycardia, unspecified: Secondary | ICD-10-CM | POA: Insufficient documentation

## 2015-10-22 DIAGNOSIS — Z792 Long term (current) use of antibiotics: Secondary | ICD-10-CM | POA: Insufficient documentation

## 2015-10-22 DIAGNOSIS — Z3A00 Weeks of gestation of pregnancy not specified: Secondary | ICD-10-CM | POA: Insufficient documentation

## 2015-10-22 DIAGNOSIS — N939 Abnormal uterine and vaginal bleeding, unspecified: Secondary | ICD-10-CM

## 2015-10-22 DIAGNOSIS — Z79899 Other long term (current) drug therapy: Secondary | ICD-10-CM | POA: Insufficient documentation

## 2015-10-22 DIAGNOSIS — O9989 Other specified diseases and conditions complicating pregnancy, childbirth and the puerperium: Secondary | ICD-10-CM | POA: Insufficient documentation

## 2015-10-22 LAB — TYPE AND SCREEN
ABO/RH(D): O POS
ANTIBODY SCREEN: NEGATIVE

## 2015-10-22 LAB — CBC WITH DIFFERENTIAL/PLATELET
BASOS ABS: 0 10*3/uL (ref 0–0.1)
BASOS PCT: 0 %
Eosinophils Absolute: 0.4 10*3/uL (ref 0–0.7)
Eosinophils Relative: 3 %
HEMATOCRIT: 34.6 % — AB (ref 35.0–47.0)
Hemoglobin: 11.8 g/dL — ABNORMAL LOW (ref 12.0–16.0)
LYMPHS PCT: 21 %
Lymphs Abs: 2.7 10*3/uL (ref 1.0–3.6)
MCH: 30.6 pg (ref 26.0–34.0)
MCHC: 34 g/dL (ref 32.0–36.0)
MCV: 90 fL (ref 80.0–100.0)
Monocytes Absolute: 1.2 10*3/uL — ABNORMAL HIGH (ref 0.2–0.9)
Monocytes Relative: 9 %
NEUTROS ABS: 8.9 10*3/uL — AB (ref 1.4–6.5)
NEUTROS PCT: 67 %
Platelets: 320 10*3/uL (ref 150–440)
RBC: 3.84 MIL/uL (ref 3.80–5.20)
RDW: 12.9 % (ref 11.5–14.5)
WBC: 13.3 10*3/uL — AB (ref 3.6–11.0)

## 2015-10-22 MED ORDER — LORAZEPAM 2 MG/ML IJ SOLN
0.5000 mg | Freq: Once | INTRAMUSCULAR | Status: AC
  Administered 2015-10-22: 0.5 mg via INTRAVENOUS

## 2015-10-22 MED ORDER — ONDANSETRON HCL 4 MG/2ML IJ SOLN
4.0000 mg | Freq: Once | INTRAMUSCULAR | Status: AC
  Administered 2015-10-22: 4 mg via INTRAVENOUS

## 2015-10-22 MED ORDER — HYDROMORPHONE HCL 1 MG/ML IJ SOLN
INTRAMUSCULAR | Status: AC
  Administered 2015-10-22: 0.5 mg via INTRAVENOUS
  Filled 2015-10-22: qty 1

## 2015-10-22 MED ORDER — ONDANSETRON HCL 4 MG/2ML IJ SOLN
4.0000 mg | Freq: Once | INTRAMUSCULAR | Status: AC
Start: 2015-10-22 — End: 2015-10-22
  Administered 2015-10-22: 4 mg via INTRAVENOUS
  Filled 2015-10-22: qty 2

## 2015-10-22 MED ORDER — ONDANSETRON HCL 4 MG/2ML IJ SOLN
INTRAMUSCULAR | Status: AC
  Administered 2015-10-22: 4 mg via INTRAVENOUS
  Filled 2015-10-22: qty 2

## 2015-10-22 MED ORDER — MISOPROSTOL 200 MCG PO TABS
600.0000 ug | ORAL_TABLET | Freq: Once | ORAL | Status: AC
  Administered 2015-10-23: 600 ug via ORAL
  Filled 2015-10-22: qty 3

## 2015-10-22 MED ORDER — LORAZEPAM 2 MG/ML IJ SOLN
INTRAMUSCULAR | Status: AC
  Administered 2015-10-22: 0.5 mg via INTRAVENOUS
  Filled 2015-10-22: qty 1

## 2015-10-22 MED ORDER — HYDROMORPHONE HCL 1 MG/ML IJ SOLN
1.0000 mg | Freq: Once | INTRAMUSCULAR | Status: AC
  Administered 2015-10-22: 1 mg via INTRAVENOUS
  Filled 2015-10-22: qty 1

## 2015-10-22 MED ORDER — SODIUM CHLORIDE 0.9 % IV SOLN
Freq: Once | INTRAVENOUS | Status: AC
  Administered 2015-10-22: 20:00:00 via INTRAVENOUS

## 2015-10-22 MED ORDER — HYDROMORPHONE HCL 1 MG/ML IJ SOLN
0.5000 mg | Freq: Once | INTRAMUSCULAR | Status: AC
  Administered 2015-10-22: 0.5 mg via INTRAVENOUS

## 2015-10-22 MED ORDER — METHYLERGONOVINE MALEATE 0.2 MG/ML IJ SOLN
0.2000 mg | Freq: Once | INTRAMUSCULAR | Status: AC
  Administered 2015-10-22: 0.2 mg via INTRAMUSCULAR
  Filled 2015-10-22: qty 1

## 2015-10-22 NOTE — ED Notes (Signed)
Pt passed large clots.

## 2015-10-22 NOTE — ED Notes (Addendum)
Pt states she began bleeding yesterday, bleeding increased today. Pt is cursing from being in pain. Pt is being advised to take deep breaths to help with pain. Pt states she has noticed clots.

## 2015-10-22 NOTE — ED Notes (Signed)
Pt transferred to US via stretcher.

## 2015-10-22 NOTE — ED Notes (Signed)
Pt saturated 2 chucks and 1 depends

## 2015-10-22 NOTE — ED Provider Notes (Signed)
Time Seen: Approximately 1950  I have reviewed the triage notes  Chief Complaint: Vaginal Bleeding   History of Present Illness: Janice Wood is a 20 y.o. female who states to pass that she is approximately [redacted] weeks pregnant and historically ihas been gravida 2, 1 elective abortion. Later review of the patient's previous ultrasound here shows that she is approximately [redacted] weeks pregnant. Patient states she had spotty vaginal bleeding that started yesterday and continued throughout today and then had sudden onset of heavy vaginal bleeding. She states she's, went through multiple pads at home. She states she did retrieve a fetus from the toilet. She didn't bring it with her in a jar and does appear to be complete. She states she's been passing heavy clots and possibly tissue. The patient arrives very anxious and is somewhat difficult to acquire history. She denies any loss of consciousness and is complaining of crampy intermittent abdominal pain.  No past medical history on file.  There are no active problems to display for this patient.   No past surgical history on file.  No past surgical history on file.  Current Outpatient Rx  Name  Route  Sig  Dispense  Refill  . doxycycline (VIBRA-TABS) 100 MG tablet   Oral   Take 1 tablet (100 mg total) by mouth 2 (two) times daily.   28 tablet   0   . metroNIDAZOLE (FLAGYL) 500 MG tablet   Oral   Take 1 tablet (500 mg total) by mouth 2 (two) times daily.   14 tablet   0   . nitrofurantoin, macrocrystal-monohydrate, (MACROBID) 100 MG capsule   Oral   Take 1 capsule (100 mg total) by mouth 2 (two) times daily.   14 capsule   0   . ondansetron (ZOFRAN) 4 MG tablet   Oral   Take 1 tablet (4 mg total) by mouth daily as needed for nausea or vomiting.   10 tablet   0   . oxyCODONE-acetaminophen (ROXICET) 5-325 MG tablet   Oral   Take 1 tablet by mouth every 6 (six) hours as needed for severe pain.   10 tablet   0     Allergies:   Review of patient's allergies indicates no known allergies.  Family History: No family history on file.  Social History: Social History  Substance Use Topics  . Smoking status: Never Smoker   . Smokeless tobacco: Not on file  . Alcohol Use: No     Review of Systems:   10 point review of systems was performed and was otherwise negative:  Constitutional: No fever Eyes: No visual disturbances ENT: No sore throat, ear pain Cardiac: No chest pain Respiratory: No shortness of breath, wheezing, or stridor Abdomen: No abdominal pain, no vomiting, No diarrhea Endocrine: No weight loss, No night sweats Extremities: No peripheral edema, cyanosis Skin: No rashes, easy bruising Neurologic: No focal weakness, trouble with speech or swollowing Urologic: No dysuria, Hematuria, or urinary frequency   Physical Exam:  ED Triage Vitals  Enc Vitals Group     BP 10/22/15 1947 116/49 mmHg     Pulse Rate 10/22/15 1947 106     Resp 10/22/15 1947 18     Temp 10/22/15 1947 98.2 F (36.8 C)     Temp Source 10/22/15 1947 Oral     SpO2 10/22/15 1947 100 %     Weight 10/22/15 1947 145 lb (65.772 kg)     Height 10/22/15 1947  (1.727 m)  Head Cir --      Peak Flow --      Pain Score 10/22/15 1947 10     Pain Loc --      Pain Edu? --      Excl. in GC? --     General: Awake , Alert , and Oriented times 3; GCS 15 Head: Normal cephalic , atraumatic Eyes: Pupils equal , round, reactive to light Nose/Throat: No nasal drainage, patent upper airway without erythema or exudate.  Neck: Supple, Full range of motion, No anterior adenopathy or palpable thyroid masses Lungs: Clear to ascultation without wheezes , rhonchi, or rales Heart: Tachycardic , regular rhythm without murmurs , gallops , or rubs Abdomen: Soft, non tender without rebound, guarding , or rigidity; bowel sounds positive and symmetric in all 4 quadrants. No organomegaly .        Extremities: 2 plus symmetric pulses. No edema,  clubbing or cyanosis Neurologic: normal ambulation, Motor symmetric without deficits, sensory intact Skin: warm, dry, no rashes Pelvic exam with chaperone present shows limited view with large clots within the vaginal vault  Labs:   All laboratory work was reviewed including any pertinent negatives or positives listed below:  Labs Reviewed  CBC WITH DIFFERENTIAL/PLATELET - Abnormal; Notable for the following:    WBC 13.3 (*)    Hemoglobin 11.8 (*)    HCT 34.6 (*)    Neutro Abs 8.9 (*)    Monocytes Absolute 1.2 (*)    All other components within normal limits  TYPE AND SCREEN  Patient's hemoglobin was dropped slightly from previous hemoglobin on February 17. Blood type is O+  Radiology: *    Narrative:    CLINICAL DATA: Vaginal bleeding for 2 days, question retained products. Patient reports she passed products of conception today.  EXAM: TRANSABDOMINAL ULTRASOUND OF PELVIS  TECHNIQUE: Transabdominal ultrasound examination of the pelvis was performed including evaluation of the uterus, ovaries, adnexal regions, and pelvic cul-de-sac. Patient declined transvaginal imaging secondary to increasing pain.  COMPARISON: Obstetric ultrasound 10/03/2015  FINDINGS: Uterus  Measurements: 13.3 x 6.2 x 6.6. No myometrial lesions are seen.  Endometrium  Thickness: Heterogeneous thickened throughout measuring 23.4 mm. The previous intrauterine gestational sac and fetal pole are no longer seen. The heterogeneous material demonstrates increased internal blood flow, most prominently about the fundus. Heterogeneous material throughout in the region of the cervix consistent with blood products.  Right ovary  Measurements: 3.3 x 2.0 x 2.9 cm. Normal appearance/no adnexal mass.  Left ovary  Measurements: Not visualized.  Other findings: No abnormal free fluid.  IMPRESSION: Previous intrauterine gestation no longer seen. The endometrium is heterogeneously thickened with  internal vascularity, concerning for retained products of conception.      I personally reviewed the radiologic studies     ED Course: * The patient had an IV established and was given Dilaudid IV for pain and some low-dose Ativan for anxiety. The patient was initiated on laboratory work and otherwise has remained hemodynamically stable. She's had some improvement of her crampy abdominal pain though she continues to pass large clots. There does not appear to be any obvious retained products of conception at this time. The patient was given IM methargen ED and later misoprostol. Patient's bleeding has slowed and I have consulted with OB/GYN unassigned he is agreed to see and evaluate the patient.   Assessment: * Incomplete miscarriage   Final Clinical Impression  Final diagnoses:  Vaginal bleeding  Retained products of conception after delivery without hemorrhage  Plan: OB/GYN consultation           Jennye MoccasinBrian S Quigley, MD 10/22/15 2356

## 2015-10-22 NOTE — ED Notes (Signed)
Pt returned from U/S via stretcher. 

## 2015-10-22 NOTE — ED Notes (Signed)
Called pharmacy to get dose of Cytotec, Pharmacy stated they would send it to ED.

## 2015-10-22 NOTE — ED Notes (Signed)
Pt in with co vaginal bleeding passed poc at home in toilet, here for persistent bleeding and pain.

## 2015-10-22 NOTE — ED Notes (Signed)
Pt moved to pelvic stretcher.

## 2015-10-23 MED ORDER — MEDROXYPROGESTERONE ACETATE 150 MG/ML IM SUSP
150.0000 mg | Freq: Once | INTRAMUSCULAR | Status: DC
  Filled 2015-10-23: qty 1

## 2015-10-23 NOTE — ED Notes (Signed)
Pt provided pads, paper scrub pants, and underwear to be discharged.

## 2015-10-23 NOTE — ED Notes (Signed)
Formulin specimen walked down to lab at this time by this RN, Junie PanningLauryn and ED tech Granthanh at this time. Handed specimen to Joe, in lab. Charge RN Judie GrieveBryan also made aware.

## 2015-10-23 NOTE — Discharge Instructions (Signed)
Miscarriage  A miscarriage is the sudden loss of an unborn baby (fetus) before the 20th week of pregnancy. Most miscarriages happen in the first 3 months of pregnancy. Sometimes, it happens before a woman even knows she is pregnant. A miscarriage is also called a "spontaneous miscarriage" or "early pregnancy loss." Having a miscarriage can be an emotional experience. Talk with your caregiver about any questions you may have about miscarrying, the grieving process, and your future pregnancy plans.  CAUSES    Problems with the fetal chromosomes that make it impossible for the baby to develop normally. Problems with the baby's genes or chromosomes are most often the result of errors that occur, by chance, as the embryo divides and grows. The problems are not inherited from the parents.   Infection of the cervix or uterus.    Hormone problems.    Problems with the cervix, such as having an incompetent cervix. This is when the tissue in the cervix is not strong enough to hold the pregnancy.    Problems with the uterus, such as an abnormally shaped uterus, uterine fibroids, or congenital abnormalities.    Certain medical conditions.    Smoking, drinking alcohol, or taking illegal drugs.    Trauma.   Often, the cause of a miscarriage is unknown.   SYMPTOMS    Vaginal bleeding or spotting, with or without cramps or pain.   Pain or cramping in the abdomen or lower back.   Passing fluid, tissue, or blood clots from the vagina.  DIAGNOSIS   Your caregiver will perform a physical exam. You may also have an ultrasound to confirm the miscarriage. Blood or urine tests may also be ordered.  TREATMENT    Sometimes, treatment is not necessary if you naturally pass all the fetal tissue that was in the uterus. If some of the fetus or placenta remains in the body (incomplete miscarriage), tissue left behind may become infected and must be removed. Usually, a dilation and curettage (D and C) procedure is performed.  During a D and C procedure, the cervix is widened (dilated) and any remaining fetal or placental tissue is gently removed from the uterus.   Antibiotic medicines are prescribed if there is an infection. Other medicines may be given to reduce the size of the uterus (contract) if there is a lot of bleeding.   If you have Rh negative blood and your baby was Rh positive, you will need a Rh immunoglobulin shot. This shot will protect any future baby from having Rh blood problems in future pregnancies.  HOME CARE INSTRUCTIONS    Your caregiver may order bed rest or may allow you to continue light activity. Resume activity as directed by your caregiver.   Have someone help with home and family responsibilities during this time.    Keep track of the number of sanitary pads you use each day and how soaked (saturated) they are. Write down this information.    Do not use tampons. Do not douche or have sexual intercourse until approved by your caregiver.    Only take over-the-counter or prescription medicines for pain or discomfort as directed by your caregiver.    Do not take aspirin. Aspirin can cause bleeding.    Keep all follow-up appointments with your caregiver.    If you or your partner have problems with grieving, talk to your caregiver or seek counseling to help cope with the pregnancy loss. Allow enough time to grieve before trying to get pregnant again.     SEEK IMMEDIATE MEDICAL CARE IF:    You have severe cramps or pain in your back or abdomen.   You have a fever.   You pass large blood clots (walnut-sized or larger) ortissue from your vagina. Save any tissue for your caregiver to inspect.    Your bleeding increases.    You have a thick, bad-smelling vaginal discharge.   You become lightheaded, weak, or you faint.    You have chills.   MAKE SURE YOU:   Understand these instructions.   Will watch your condition.   Will get help right away if you are not doing well or get worse.     This  information is not intended to replace advice given to you by your health care provider. Make sure you discuss any questions you have with your health care provider.     Document Released: 01/26/2001 Document Revised: 11/27/2012 Document Reviewed: 09/21/2011  Elsevier Interactive Patient Education 2016 [employer]

## 2015-10-23 NOTE — Consult Note (Signed)
GYN CONSULT NOTE:  LMP: Jul 24, 2015 EDD: 04/28/16 by Janice Wood  No insurance, not receiving prenatal care  Janice Wood is a 20 y.o. G3P0020 @ 13+1 by 10 wk sono here for incomplete AB. Marland Kitchen Patient states she had spotty vaginal bleeding that started yesterday and continued throughout today and then had sudden onset of heavy vaginal bleeding. She went through multiple pads at home and passed a fetus in the toilet. She brought the fetus in today.    She has continued to have heavy bleeding in the ED.   No fevers/chills/n/v/dysuria. No other complaints.   OBHx: 2 previous TAB, age 40 and 11 GYN: denies abnl menses, GC/CT, HSV PMH: denies Psurghx: denies SH: smokes 1ppd, no drugs/ETOH, feels safe in her relationship with her boyfriend ALL: NKDA  O: Filed Vitals:   10/22/15 2130 10/22/15 2253  BP: 121/79 121/67  Pulse: 79 77  Temp:    Resp: 18 20   General appearance: alert, cooperative and no distress Head: Normocephalic, without obvious abnormality, atraumatic Abdomen: soft, non-tender; bowel sounds normal; no masses,  no organomegaly Pelvic: cervix normal in appearance, external genitalia normal, no adnexal masses or tenderness, no bladder tenderness, rectovaginal septum normal and urethra without abnormality or discharge; Placenta sitting in vaginal vault, removed with ring forceps in pieces at first and then intact from the cervical os; normal appearing cervix after removal of placental tissue, minimal bleeding; uterus firm, nontender Extremities: extremities normal, atraumatic, no cyanosis or edema  CBC Latest Ref Rng 10/22/2015 10/03/2015 05/27/2015  WBC 3.6 - 11.0 K/uL 13.3(H) 8.1 11.3(H)  Hemoglobin 12.0 - 16.0 g/dL 11.8(L) 13.9 13.5  Hematocrit 35.0 - 47.0 % 34.6(L) 40.4 39.7  Platelets 150 - 440 K/uL 320 325 326    O+ blood type  Uterus  Measurements: 13.3 x 6.2 x 6.6. No myometrial lesions are seen.  Endometrium  Thickness: Heterogeneous thickened throughout  measuring 23.4 mm. The previous intrauterine gestational sac and fetal pole are no longer seen. The heterogeneous material demonstrates increased internal blood flow, most prominently about the fundus. Heterogeneous material throughout in the region of the cervix consistent with blood products.  Right ovary  Measurements: 3.3 x 2.0 x 2.9 cm. Normal appearance/no adnexal mass.  Left ovary  Measurements: Not visualized.  Other findings: No abnormal free fluid.  IMPRESSION: Previous intrauterine gestation no longer seen. The endometrium is heterogeneously thickened with internal vascularity, concerning for retained products of conception.  A/P;  20 y.o. G3P0020 @ 13+1 by 10 wk sono with incomplete AB, hemodynamically stable. Now that the placenta has been removed, it is likely that her uterus will clamp down. We should watch her for another hour to ensure her bleeding is less than 1 pad/hour. Cytotec buccally ordered to assist in continued bleeding. Discussed with patient that cytotec can work over the course of a week to help expel remaining blood clots in the uterus and she should expect continued bleeding but not more than 1 pad/hour for 2 hours. We discussed that if she were to continue to have heavy bleeding and repeat ultrasound continued to show possible retained products, we would proceed with suction D&C, but as she is hemodynamically stable now and the placenta has been removed from the os, we will defer that at this time. She would like depo-provera birth control prior to discharge.   1. Incomplete AB - cytotec buccally  - monitor bleeding until 2am - monitor vitals - RH+, no need for rhogam - send placenta  to pathology  2. BCM - depo-provera 150mcg IM prior to discharge  F/U with [facility] (Taleah Bellantoni) in 1 week  I spent approximately 60 minutes with the patient  Ala DachJohanna K Zaydenn Balaguer, MD

## 2015-10-23 NOTE — ED Provider Notes (Signed)
-----------------------------------------   2:08 AM on 10/23/2015 -----------------------------------------  Patient has been seen by OB/GYN, recommend follow-up as an outpatient in 1 week. Patient has refused a pain shot. Labs show a reassuring hemoglobin, patient states she feels the bleeding has decreased. I discussed with the patient for any increased bleeding, fever, lightheadedness she is to return to the emergency department, otherwise she should follow up with OB/GYN in one week. Patient agreeable to plan.  Minna AntisKevin Gwendoline Judy, MD 10/23/15 [phone]

## 2015-10-26 LAB — SURGICAL PATHOLOGY

## 2016-04-07 IMAGING — US US OB COMP LESS 14 WK
1 series · 14 of 28 positions shown · non-contrast
Comparison: None for this pregnancy

CLINICAL DATA: 19-year-old pregnant female with vaginal bleeding

EXAM:
OBSTETRIC <14 WK ULTRASOUND
TECHNIQUE: Transabdominal ultrasound was performed for evaluation of the
gestation as well as the maternal uterus and adnexal regions.

[Series 1: us ob comp less 14 wk · 0.16mm/px · 14 of 47 slices shown]
[im 2/47]
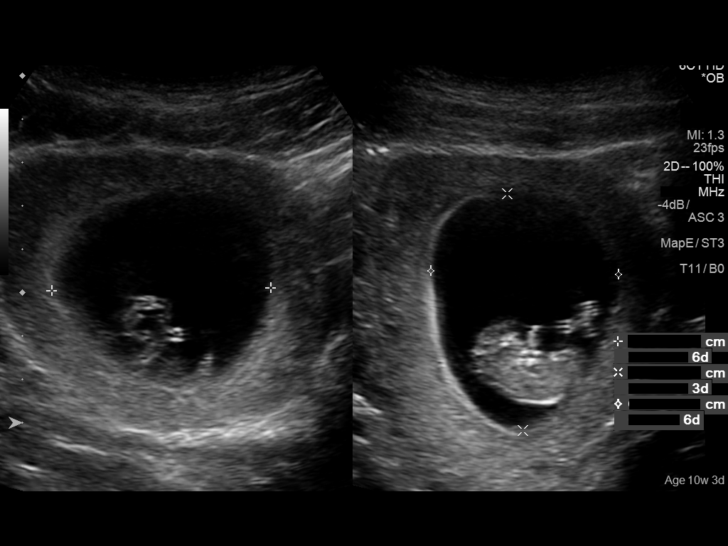
[im 6/47]
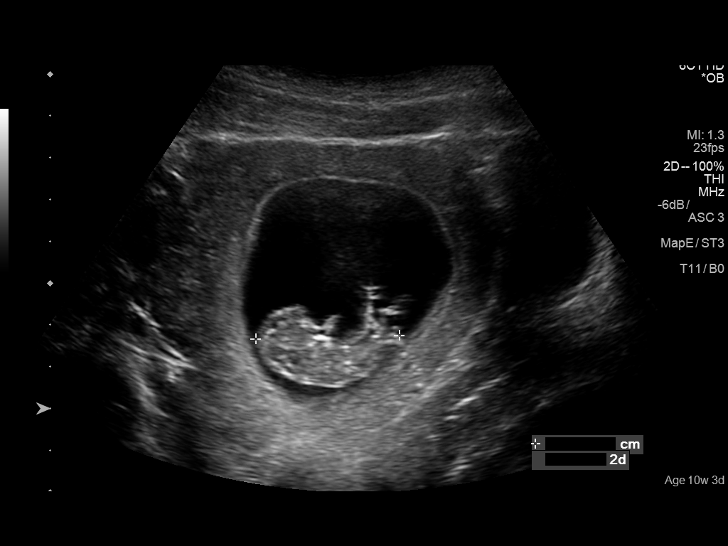
[im 9/47]
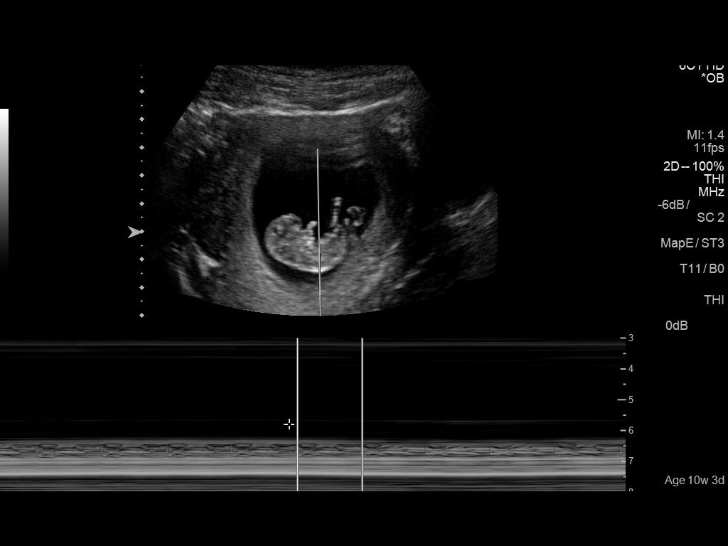
[im 12/47]
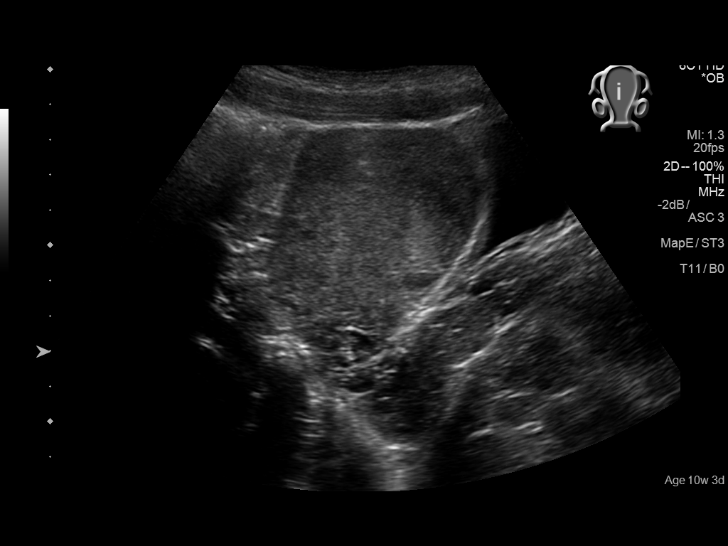
[im 16/47]
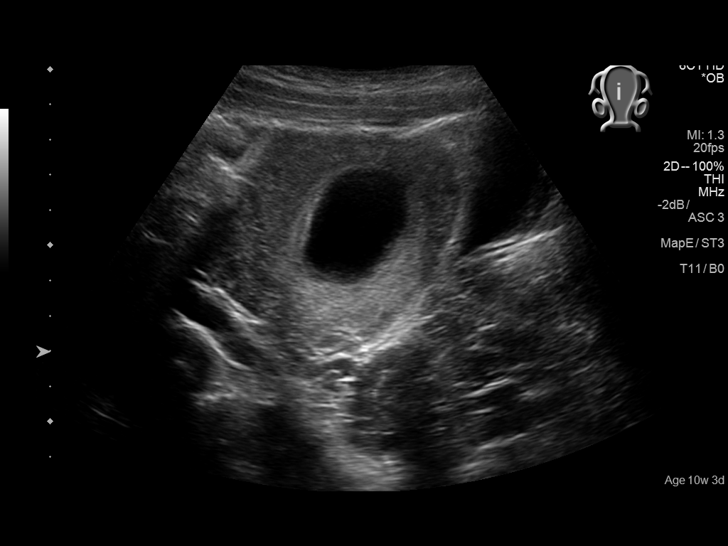
[im 19/47]
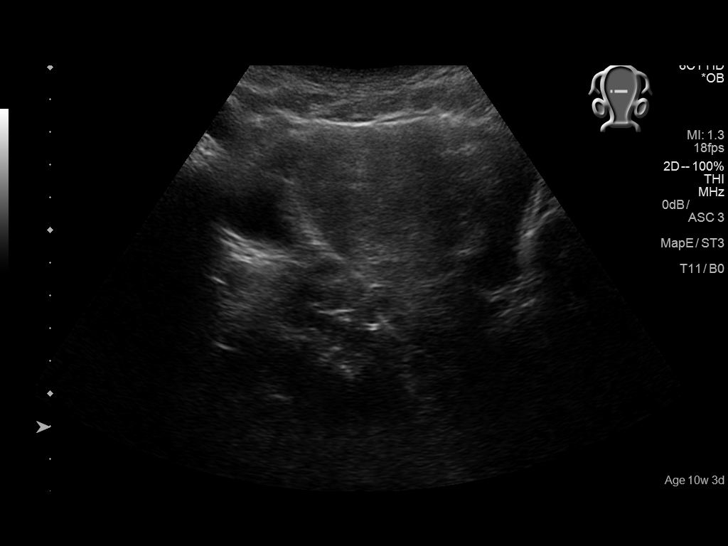
[im 23/47]
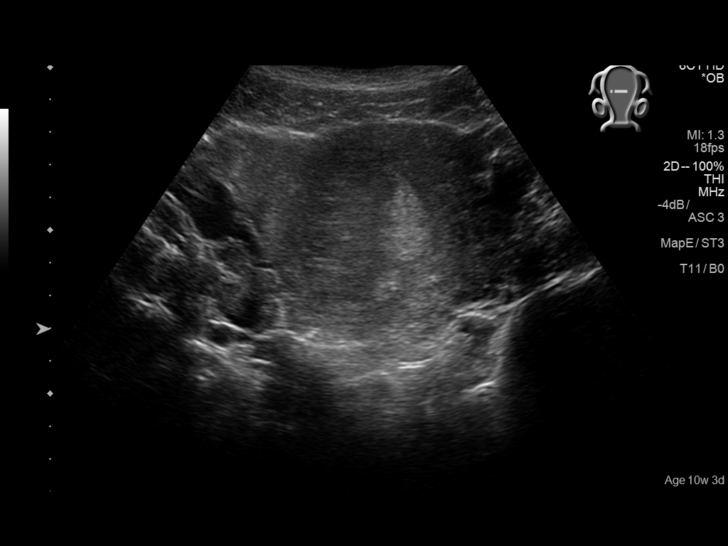
[im 26/47]
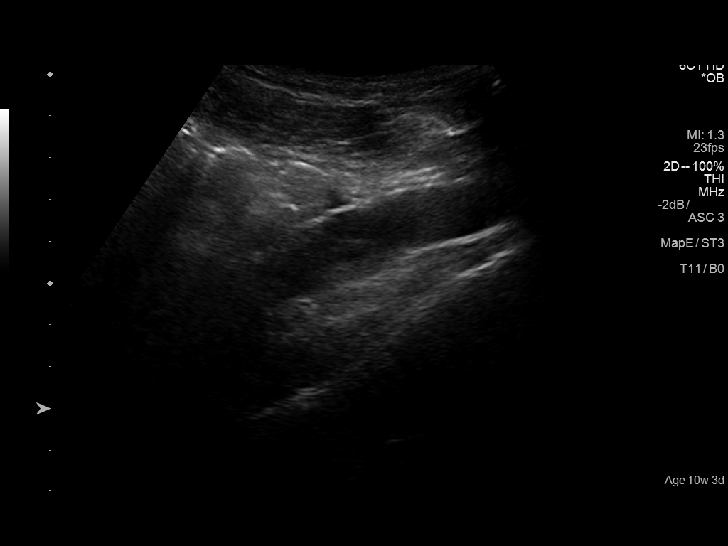
[im 29/47]
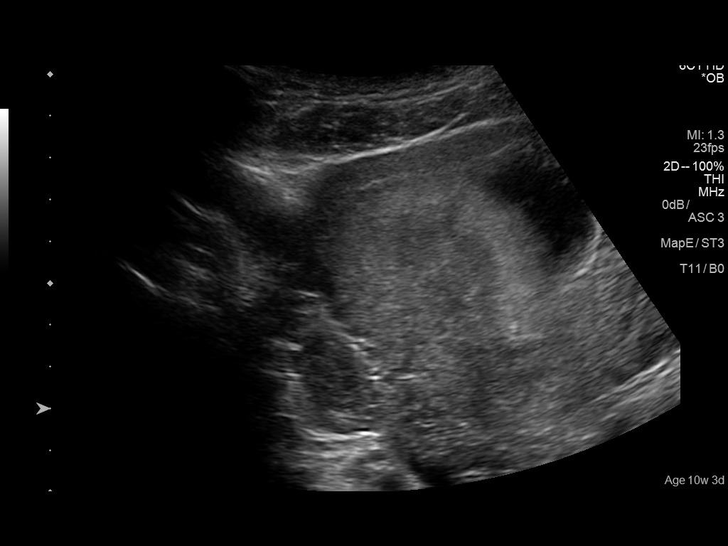
[im 33/47]
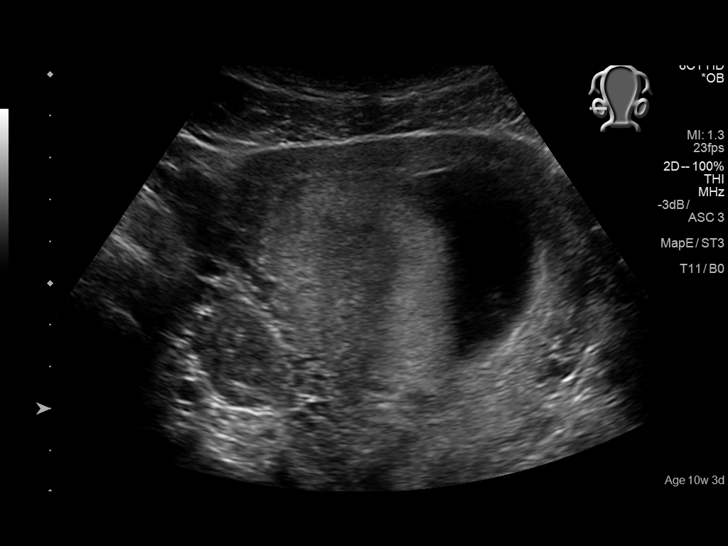
[im 36/47]
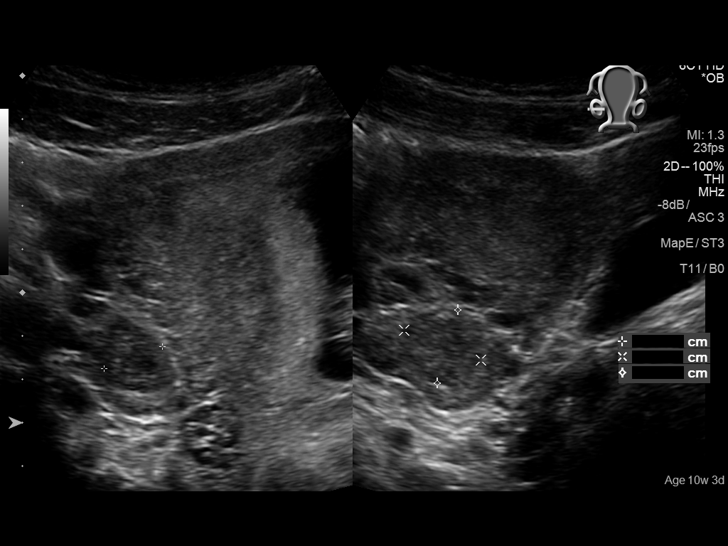
[im 40/47]
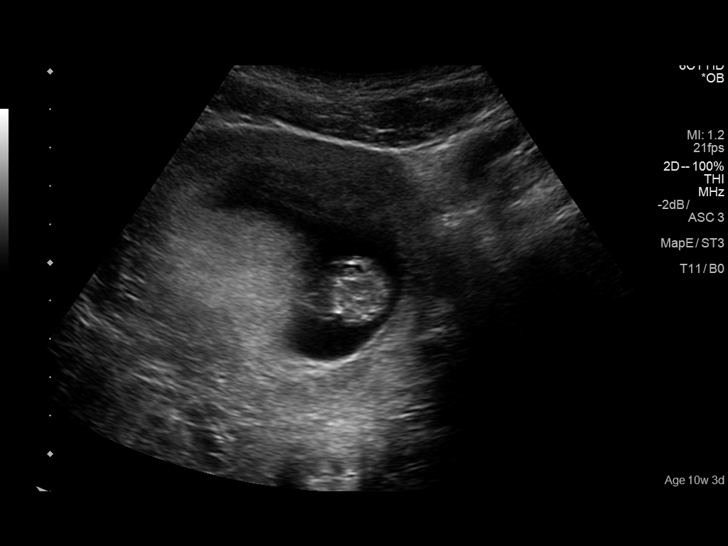
[im 43/47]
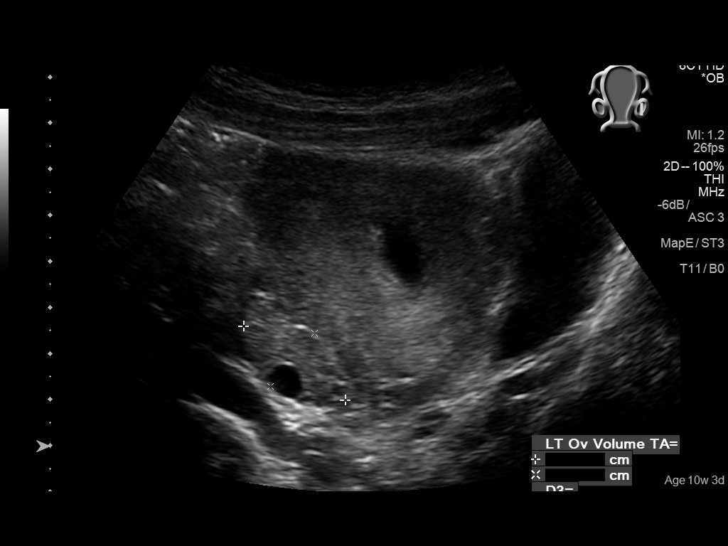
[im 47/47]
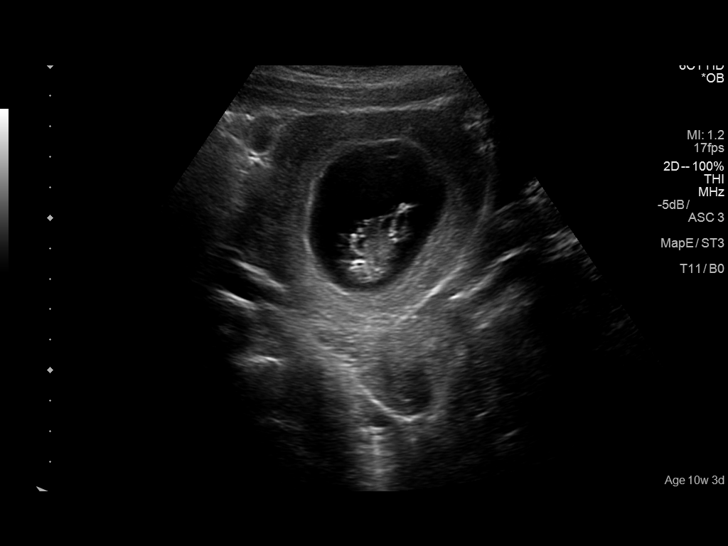

[14 of 28 positions shown; findings below may reference images not displayed]

FINDINGS: Intrauterine gestational sac: Single intrauterine gestational sac.

Yolk sac:  Seen

Embryo:  Present

Cardiac Activity: Detected

Heart Rate: 158 bpm

CRL:   35  mm   10 w 2d                  US EDC: 04/28/2016

Subchorionic hemorrhage:  None visualized.

Maternal uterus/adnexae: The maternal ovaries appear unremarkable. A
2 cm probable corpus luteum is noted in the right ovary.

There is trace free fluid within the pelvis.
IMPRESSION: Single live intrauterine pregnancy with an estimated gestational age
of 10 weeks, 2 days.

## 2016-04-22 ENCOUNTER — Encounter (HOSPITAL_COMMUNITY): Payer: Self-pay | Admitting: Emergency Medicine

## 2016-04-22 ENCOUNTER — Emergency Department (HOSPITAL_COMMUNITY)
Admission: EM | Admit: 2016-04-22 | Discharge: 2016-04-22 | Disposition: A | Discharge status: Home / Self Care | Payer: Medicaid Other | Attending: Emergency Medicine | Admitting: Emergency Medicine

## 2016-04-22 DIAGNOSIS — R1030 Lower abdominal pain, unspecified: Secondary | ICD-10-CM | POA: Insufficient documentation

## 2016-04-22 DIAGNOSIS — N898 Other specified noninflammatory disorders of vagina: Secondary | ICD-10-CM

## 2016-04-22 LAB — URINALYSIS, ROUTINE W REFLEX MICROSCOPIC
GLUCOSE, UA: NEGATIVE mg/dL
KETONES UR: NEGATIVE mg/dL
Nitrite: NEGATIVE
PH: 5.5 (ref 5.0–8.0)
Protein, ur: 30 mg/dL — AB
Specific Gravity, Urine: 1.031 — ABNORMAL HIGH (ref 1.005–1.030)

## 2016-04-22 LAB — COMPREHENSIVE METABOLIC PANEL
ALBUMIN: 4.6 g/dL (ref 3.5–5.0)
ALT: 12 U/L — AB (ref 14–54)
AST: 18 U/L (ref 15–41)
Alkaline Phosphatase: 54 U/L (ref 38–126)
Anion gap: 6 (ref 5–15)
BUN: 8 mg/dL (ref 6–20)
CO2: 27 mmol/L (ref 22–32)
Calcium: 9.7 mg/dL (ref 8.9–10.3)
Chloride: 105 mmol/L (ref 101–111)
Creatinine, Ser: 0.62 mg/dL (ref 0.44–1.00)
GFR calc Af Amer: >60 mL/min (ref >60)
GFR calc non Af Amer: >60 mL/min (ref >60)
GLUCOSE: 95 mg/dL (ref 65–99)
POTASSIUM: 3.4 mmol/L — AB (ref 3.5–5.1)
SODIUM: 138 mmol/L (ref 135–145)
Total Bilirubin: 0.9 mg/dL (ref 0.3–1.2)
Total Protein: 9.1 g/dL — ABNORMAL HIGH (ref 6.5–8.1)

## 2016-04-22 LAB — WET PREP, GENITAL
Sperm: NONE SEEN
Trich, Wet Prep: NONE SEEN
YEAST WET PREP: NONE SEEN

## 2016-04-22 LAB — CBC
HEMATOCRIT: 36.1 % (ref 36.0–46.0)
Hemoglobin: 12.5 g/dL (ref 12.0–15.0)
MCH: 29.6 pg (ref 26.0–34.0)
MCHC: 34.6 g/dL (ref 30.0–36.0)
MCV: 85.3 fL (ref 78.0–100.0)
Platelets: 360 10*3/uL (ref 150–400)
RBC: 4.23 MIL/uL (ref 3.87–5.11)
RDW: 16.2 % — AB (ref 11.5–15.5)
WBC: 6.8 10*3/uL (ref 4.0–10.5)

## 2016-04-22 LAB — URINE MICROSCOPIC-ADD ON

## 2016-04-22 LAB — LIPASE, BLOOD: LIPASE: 28 U/L (ref 11–51)

## 2016-04-22 LAB — I-STAT BETA HCG BLOOD, ED (MC, WL, AP ONLY): I-stat hCG, quantitative: <5 m[IU]/mL (ref <5)

## 2016-04-22 MED ORDER — METRONIDAZOLE 500 MG PO TABS
500.0000 mg | ORAL_TABLET | Freq: Once | ORAL | Status: AC
  Administered 2016-04-22: 500 mg via ORAL
  Filled 2016-04-22: qty 1

## 2016-04-22 MED ORDER — AZITHROMYCIN 250 MG PO TABS
1000.0000 mg | ORAL_TABLET | Freq: Once | ORAL | Status: AC
  Administered 2016-04-22: 1000 mg via ORAL
  Filled 2016-04-22: qty 4

## 2016-04-22 MED ORDER — LIDOCAINE HCL 1 % IJ SOLN
INTRAMUSCULAR | Status: AC
  Administered 2016-04-22: 20 mL
  Filled 2016-04-22: qty 20

## 2016-04-22 MED ORDER — METRONIDAZOLE 500 MG PO TABS: 500.0000 mg | ORAL_TABLET | Freq: Two times a day (BID) | ORAL | 0 refills | Status: DC

## 2016-04-22 MED ORDER — ACETAMINOPHEN 325 MG PO TABS
650.0000 mg | ORAL_TABLET | Freq: Once | ORAL | Status: AC
  Administered 2016-04-22: 650 mg via ORAL
  Filled 2016-04-22: qty 2

## 2016-04-22 MED ORDER — CEFTRIAXONE SODIUM 250 MG IJ SOLR
250.0000 mg | Freq: Once | INTRAMUSCULAR | Status: AC
  Administered 2016-04-22: 250 mg via INTRAMUSCULAR
  Filled 2016-04-22: qty 250

## 2016-04-22 NOTE — ED Provider Notes (Signed)
[facility]-EMERGENCY DEPT Provider Note   CSN: [id number] Arrival date & time: 04/22/16  1436     History   Chief Complaint Chief Complaint  Patient presents with  . Abdominal Pain  . Vaginal Discharge    HPI Janice Wood is a 20 y.o. female.  Janice Wood is a 20 y.o. female G1P0010 who presents to ED with complaint of abdominal pain, diarrhea, and vaginal discharge. Patient reports approximately 3-4 days ago she experienced constant lower abdominal pain described as a cramping sensation with associated non-bloody loose stool. She currently endorses improvement in symptoms and denies abdominal pain currently. She has tried taking tylenol with minimal relief. She reports drinking heavily before the onset of loose stool and abdominal pain as well as eating chicken that "had been in the refrigerator for a while." No one with similar symptoms. No recent travel outside the [place]. She also notes a white vaginal discharge. No fever, changes in appetite, nausea, vomiting, dysuria, hematuria, vaginal pain, pelvic pain, or vaginal bleeding. She is sexually active with one female and one female partner in the last 6 months, intermittent use of barrier protection. She is concerned for STIs and is requesting testing and treatment. She reports one previous pregnancy that resulted in miscarriage in March 2017 at that time [place] had showed retained products of conception. Patient reports since then she has had normal menstrual periods and LMP was "last week."      History reviewed. No pertinent past medical history.  There are no active problems to display for this patient.   History reviewed. No pertinent surgical history.  OB History    Gravida Para Term Preterm AB Living   1             SAB TAB Ectopic Multiple Live Births                   Home Medications    Prior to Admission medications   Medication Sig Start Date End Date Taking? Authorizing Provider  doxycycline (VIBRA-TABS) 100 MG tablet  Take 1 tablet (100 mg total) by mouth 2 (two) times daily. Patient not taking: Reported on 04/22/2016 05/27/15   Myrna Blazer, MD  metroNIDAZOLE (FLAGYL) 500 MG tablet Take 1 tablet (500 mg total) by mouth 2 (two) times daily. 04/22/16   Lona Kettle, PA-C  nitrofurantoin, macrocrystal-monohydrate, (MACROBID) 100 MG capsule Take 1 capsule (100 mg total) by mouth 2 (two) times daily. Patient not taking: Reported on 04/22/2016 10/03/15   Governor Rooks, MD  ondansetron (ZOFRAN) 4 MG tablet Take 1 tablet (4 mg total) by mouth daily as needed for nausea or vomiting. Patient not taking: Reported on 04/22/2016 05/28/15   Darien Ramus, MD  oxyCODONE-acetaminophen (ROXICET) 5-325 MG tablet Take 1 tablet by mouth every 6 (six) hours as needed for severe pain. Patient not taking: Reported on 04/22/2016 05/27/15   Myrna Blazer, MD    Family History History reviewed. No pertinent family history.  Social History Social History  Substance Use Topics  . Smoking status: Never Smoker  . Smokeless tobacco: Not on file  . Alcohol use No     Allergies   Review of patient's allergies indicates no known allergies.   Review of Systems Review of Systems  Constitutional: Negative for chills, diaphoresis and fever.  HENT: Negative for trouble swallowing.   Eyes: Negative for visual disturbance.  Respiratory: Negative for shortness of breath.   Cardiovascular: Negative for chest pain.  Gastrointestinal: Negative for abdominal  pain, blood in stool, constipation, diarrhea, nausea and vomiting.  Genitourinary: Positive for vaginal discharge. Negative for dysuria, hematuria, pelvic pain, vaginal bleeding and vaginal pain.  Musculoskeletal: Negative for neck pain.  Skin: Negative for rash.  Neurological: Negative for syncope.     Physical Exam Updated Vital Signs BP 102/70 (BP Location: Right Arm)   Pulse 63   Temp 97.9 F (36.6 C) (Oral)   Resp 18   LMP 07/22/2015   SpO2 100%     Physical Exam  Constitutional: She appears well-developed and well-nourished. No distress.  HENT:  Head: Normocephalic and atraumatic.  Mouth/Throat: Oropharynx is clear and moist. No oropharyngeal exudate.  Eyes: Conjunctivae and EOM are normal. Pupils are equal, round, and reactive to light. Right eye exhibits no discharge. Left eye exhibits no discharge. No scleral icterus.  Neck: Normal range of motion and phonation normal. Neck supple. No neck rigidity. Normal range of motion present.  Cardiovascular: Normal rate, regular rhythm, normal heart sounds and intact distal pulses.   No murmur heard. Pulmonary/Chest: Effort normal and breath sounds normal. No stridor. No respiratory distress. She has no wheezes. She has no rales.  Abdominal: Soft. Bowel sounds are normal. She exhibits no distension. There is tenderness. There is no rigidity, no rebound, no guarding and no CVA tenderness.  Mild TTP of abdomen without guarding, rigidity, or peritoneal signs. Abdomen is soft.   Genitourinary: Vagina normal. Pelvic exam was performed with patient supine. There is no rash, tenderness or lesion on the right labia. There is no rash, tenderness or lesion on the left labia. Cervix exhibits discharge. Cervix exhibits no motion tenderness and no friability. Right adnexum displays no mass and no tenderness. Left adnexum displays no mass and no tenderness.  Genitourinary Comments: Chaperone present for duration of exam. External anatomy normal - no injury, lesions, masses, or rashes. No bleeding, lesions, masses, or ulcerations in vaginal cavity. Cervix is closed with white discharge. No friability. No CMT, no adnexal tenderness, no masses palpated on bimanual exam.   Musculoskeletal: Normal range of motion.  Lymphadenopathy:    She has no cervical adenopathy.  Neurological: She is alert. She is not disoriented. Coordination and gait normal. GCS eye subscore is 4. GCS verbal subscore is 5. GCS motor subscore  is 6.  Skin: Skin is warm and dry. She is not diaphoretic.  Psychiatric: She has a normal mood and affect. Her behavior is normal.     ED Treatments / Results  Labs (all labs ordered are listed, but only abnormal results are displayed) Labs Reviewed  COMPREHENSIVE METABOLIC PANEL - Abnormal; Notable for the following:       Result Value   Potassium 3.4 (*)    Total Protein 9.1 (*)    ALT 12 (*)    All other components within normal limits  CBC - Abnormal; Notable for the following:    RDW 16.2 (*)    All other components within normal limits  URINALYSIS, ROUTINE W REFLEX MICROSCOPIC (NOT AT [facility]) - Abnormal; Notable for the following:    Color, Urine AMBER (*)    APPearance CLOUDY (*)    Specific Gravity, Urine 1.031 (*)    Hgb urine dipstick TRACE (*)    Bilirubin Urine SMALL (*)    Protein, ur 30 (*)    Leukocytes, UA SMALL (*)    All other components within normal limits  URINE MICROSCOPIC-ADD ON - Abnormal; Notable for the following:    Squamous Epithelial / LPF 0-5 (*)  Bacteria, UA RARE (*)    All other components within normal limits  WET PREP, GENITAL  LIPASE, BLOOD  I-STAT BETA HCG BLOOD, ED ([facility], [facility], [facility] ONLY)  GC/CHLAMYDIA PROBE AMP ([facility]) NOT AT [facility]    EKG  EKG Interpretation None       Radiology No results found.  Procedures Procedures (including critical care time)  Medications Ordered in ED Medications  acetaminophen (TYLENOL) tablet 650 mg (650 mg Oral Given 04/22/16 2027)  azithromycin (ZITHROMAX) tablet 1,000 mg (1,000 mg Oral Given 04/22/16 2118)  cefTRIAXone (ROCEPHIN) injection 250 mg (250 mg Intramuscular Given 04/22/16 2121)  metroNIDAZOLE (FLAGYL) tablet 500 mg (500 mg Oral Given 04/22/16 2119)  lidocaine (XYLOCAINE) 1 % (with pres) injection (20 mLs  Given 04/22/16 2121)     Initial Impression / Assessment and Plan / ED Course  I have reviewed the triage vital signs and the nursing notes.  Pertinent labs & imaging results that  were available during my care of the patient were reviewed by me and considered in my medical decision making (see chart for details).  Clinical Course    Patient presents to ED complaint of abdominal pain and vaginal discharge. Patient is afebrile and non-toxic appearing in NAD. VSS. Patient is resting comfortably in bed talking on phone with friend listening to music. She currently denies abdominal pain. She has mild TTP in lower abdomen without guarding, rigidity, or peritoneal signs. Abdomen is soft with +BS. White discharge on pelvic exam, no friability, no TTP on bimanual. Will check labs, wet prep, GC/chlamydia.  Lipase normal - low suspicion for acute pancreatitis. CMP re-assuring - doubt acute cholecystitis. Pregnancy test negative - doubt ectopic. Small leukocytes and bacteria on U/A, squamous epithelial cells; no dysuria or hematuria  - will not treat for UTI at this time. Positive for BV. Low suspicion for acute abdomen - afebrile, stable vital signs, eating in ED, no leukocytosis, soft abdomen, no guarding/rigidity/peritoneal signs, do not feel imaging is warranted at this time. Diarrhea and recent abdominal pain may be ?acute diarrhea vs. ?food intolerance vs. Lower abdomina pain - ?possible STI (pending GC/Chlamydia).   Discussed results and plan with patient. She has had something to eat in the ED with no issue. Treated with ABX for possible STI. Rx ABX for BV. Discussed symptomatic management. Encouraged notification of sexual partners and need for testing and treatment. Encouraged establishing an OBGYN and provided contact information. Strict return precautions given. Patient voiced understanding and is agreeable.    Final Clinical Impressions(s) / ED Diagnoses   Final diagnoses:  Vaginal discharge  Lower abdominal pain    New Prescriptions Discharge Medication List as of 04/22/2016  9:15 PM       Lona KettleAshley Laurel Meyer, PA-C 04/24/16 [id number]    Benjiman CoreNathan Pickering, MD 04/25/16  [phone]

## 2016-04-22 NOTE — Discharge Instructions (Signed)
Read the information below.   You have bacterial vaginosis. You are being treated with antibiotics.  You were treated for possible sexually transmitted infection. Be sure to notify partners and make sure they are treated as well. Do not engage in sexual activity for seven days following.  Use the prescribed medication as directed.  Please discuss all new medications with your pharmacist.   It is important that you establish a primary care. [facility] Please call to establish care.  You may return to the Emergency Department at any time for worsening condition or any new symptoms that concern you. Return to ED if you develop fever, constant abdominal pain, unable to keep food/fluids down, blood in stool, or abnormal vaginal bleeding.

## 2016-04-22 NOTE — ED Notes (Signed)
Pt sitting on video chat with family. Understands plan of care.  Awaiting pelvic.

## 2016-04-22 NOTE — ED Notes (Signed)
Pt given crackers and peanut butter, soda.  No pain at this time.

## 2016-04-22 NOTE — ED Triage Notes (Signed)
Pt reports she has had midline lower abd pain for the past 2 days. Has also had white vaginal discharge and diarrhea. No emesis. LMP a week ago.

## 2016-04-23 LAB — GC/CHLAMYDIA PROBE AMP (~~LOC~~) NOT AT ARMC
Chlamydia: NEGATIVE
NEISSERIA GONORRHEA: POSITIVE — AB

## 2016-04-26 ENCOUNTER — Telehealth (HOSPITAL_COMMUNITY): Payer: Self-pay

## 2016-04-26 IMAGING — US US PELVIS COMPLETE
1 series · 13 of 25 positions shown · non-contrast
Comparison: Obstetric ultrasound 10/03/2015

CLINICAL DATA: Vaginal bleeding for 2 days, question retained
products. Patient reports she passed products of conception today.

EXAM:
TRANSABDOMINAL ULTRASOUND OF PELVIS
TECHNIQUE: Transabdominal ultrasound examination of the pelvis was performed
including evaluation of the uterus, ovaries, adnexal regions, and
pelvic cul-de-sac. Patient declined transvaginal imaging secondary
to increasing pain.

[Series 1: us pelvis complete · 0.24mm/px · 13 of 31 slices shown]
[im 1/31]
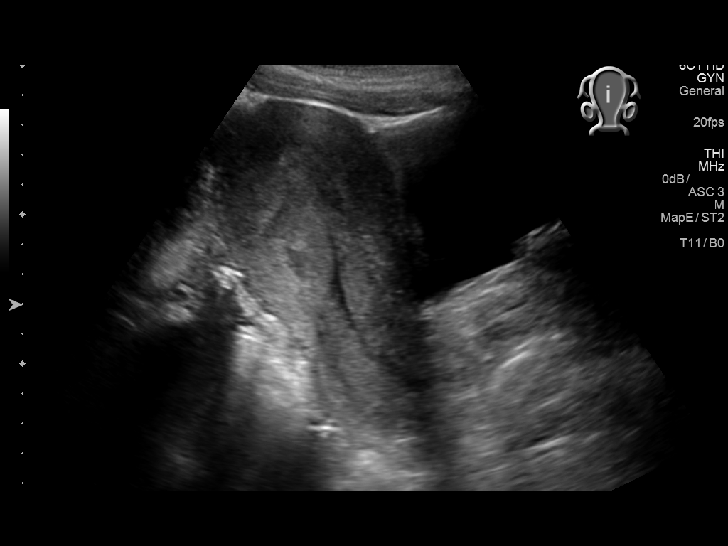
[im 3/31]
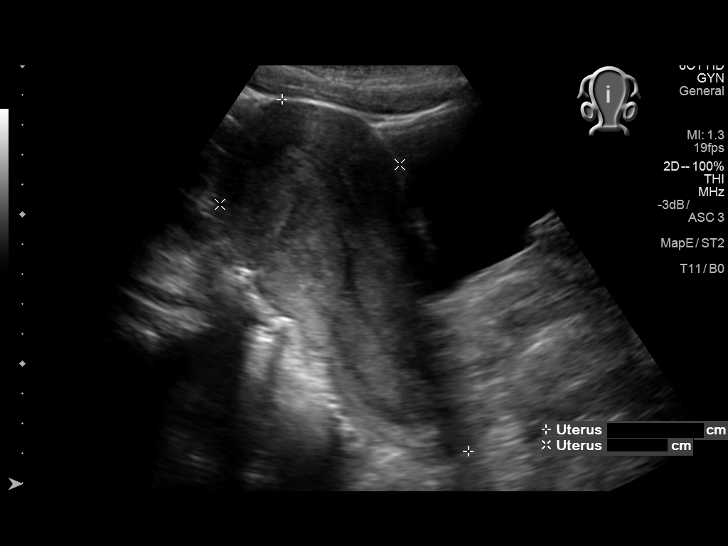
[im 6/31]
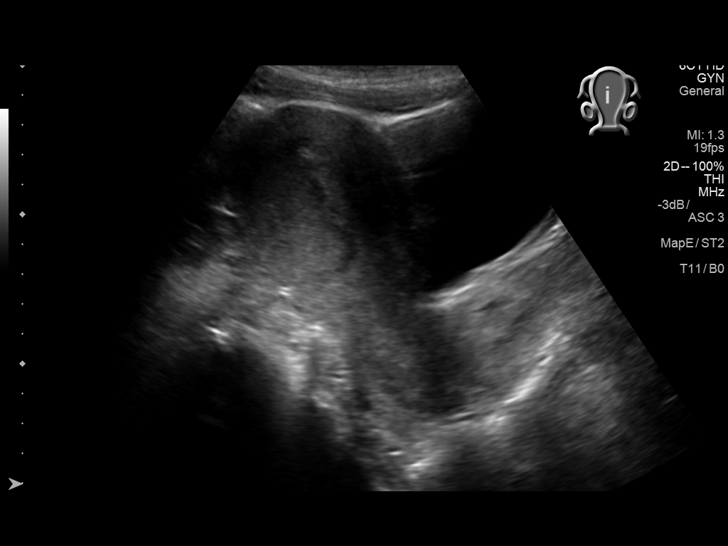
[im 8/31]
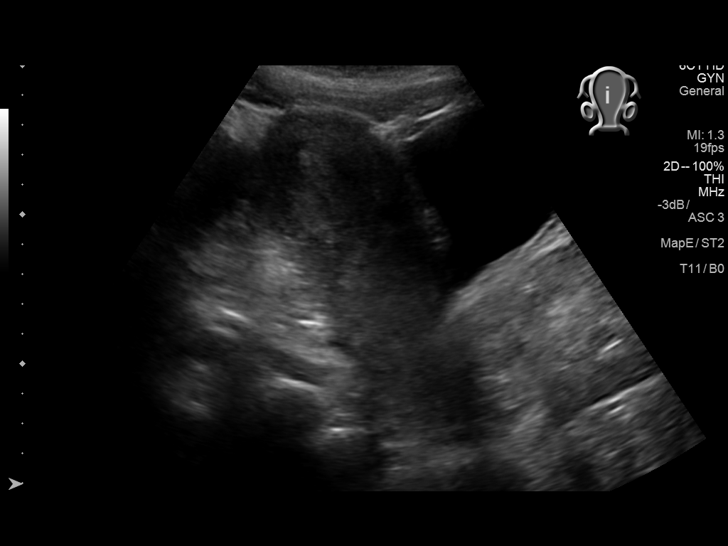
[im 11/31]
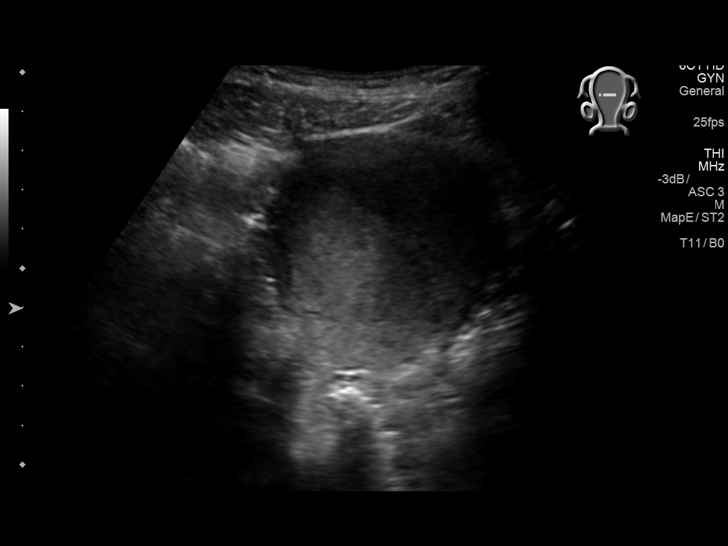
[im 13/31]
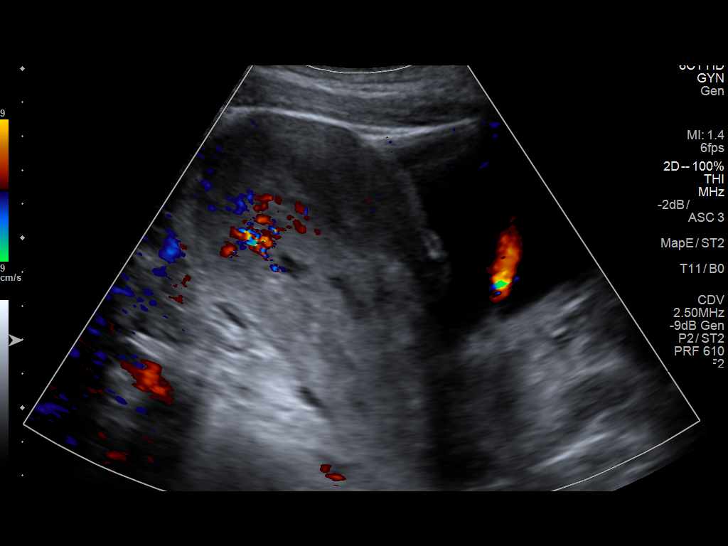
[im 16/31]
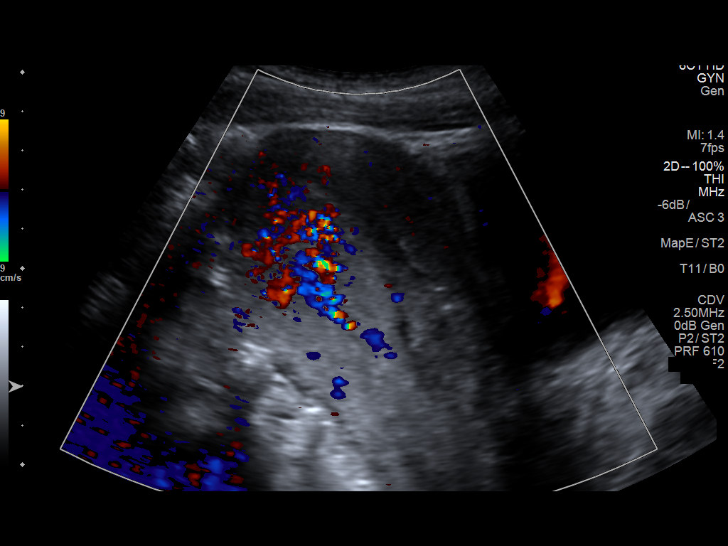
[im 18/31]
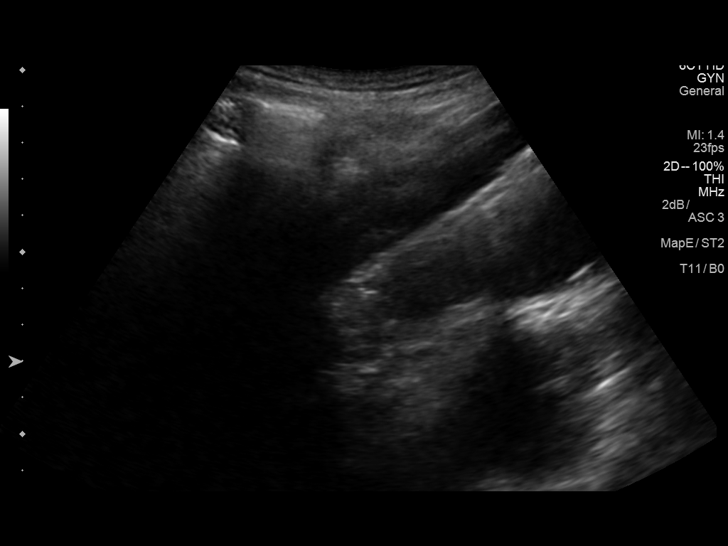
[im 21/31]
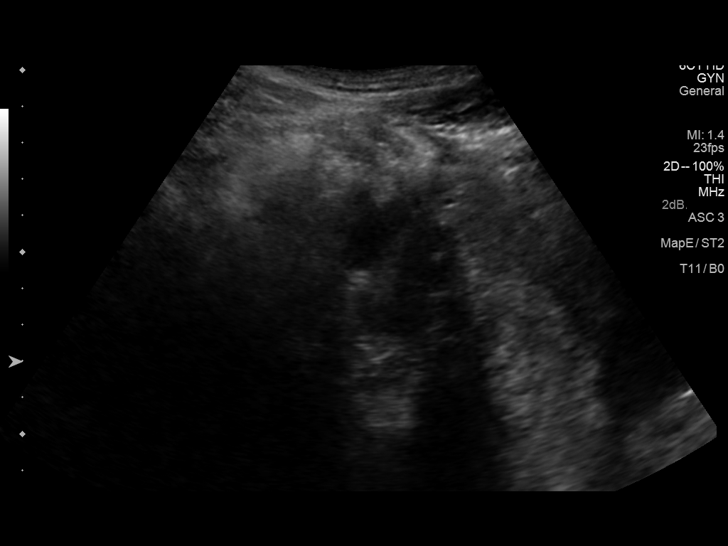
[im 23/31]
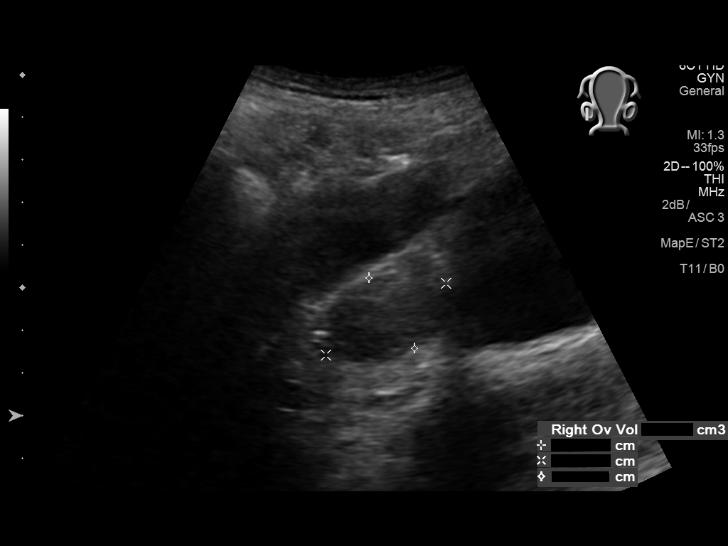
[im 26/31]
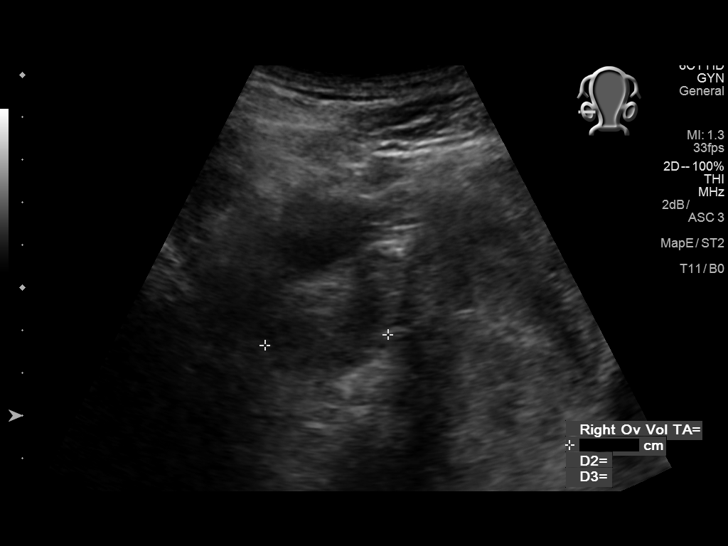
[im 28/31]
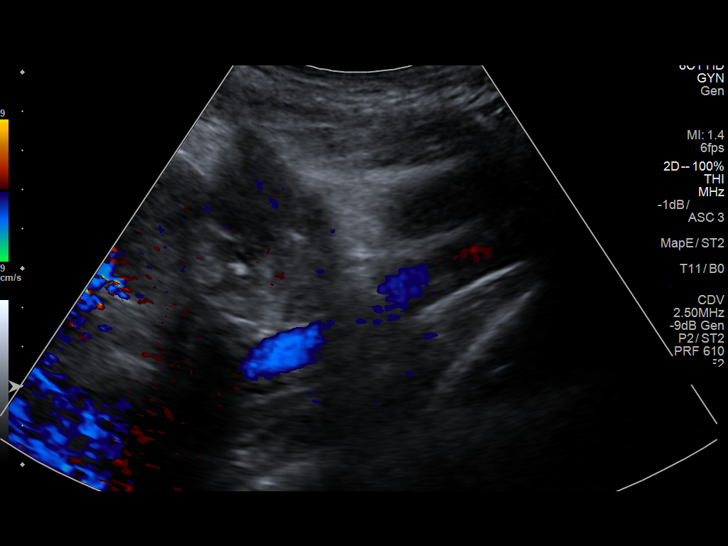
[im 31/31]
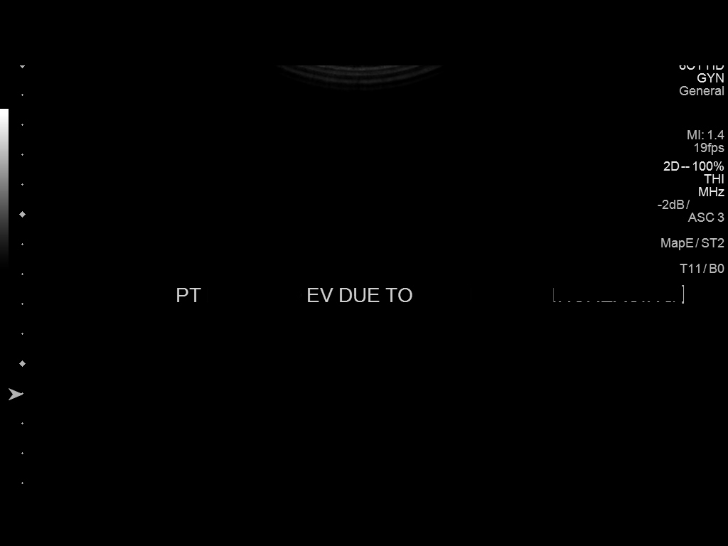

[13 of 25 positions shown; findings below may reference images not displayed]

FINDINGS: Uterus

Measurements: 13.3 x 6.2 x 6.6. No myometrial lesions are seen.

Endometrium

Thickness: Heterogeneous thickened throughout measuring 23.4 mm. The
previous intrauterine gestational sac and fetal pole are no longer
seen. The heterogeneous material demonstrates increased internal
blood flow, most prominently about the fundus. Heterogeneous
material throughout in the region of the cervix consistent with
blood products.

Right ovary

Measurements: 3.3 x 2.0 x 2.9 cm. Normal appearance/no adnexal mass.

Left ovary

Measurements: Not visualized.

Other findings:  No abnormal free fluid.
IMPRESSION: Previous intrauterine gestation no longer seen. The endometrium is
heterogeneously thickened with internal vascularity, concerning for
retained products of conception.

## 2016-04-26 NOTE — Telephone Encounter (Addendum)
[facility]  (+) Gonorrhea.  Treated with Zithromax and Rocephin.  [employer] form completed and faxed.    04/26/2016 @ 12:00 Left message for pt to return call.    04/26/2016 @ 13:20  Pt informed of dx, tx rcvd approp. , notify partner for testing and treatment and abstain from sex x 10 days post tx.

## 2016-12-15 ENCOUNTER — Ambulatory Visit (HOSPITAL_COMMUNITY)
Admission: EM | Admit: 2016-12-15 | Discharge: 2016-12-15 | Disposition: A | Discharge status: Home / Self Care | Payer: Medicaid Other | Attending: Emergency Medicine | Admitting: Emergency Medicine

## 2016-12-15 ENCOUNTER — Encounter (HOSPITAL_COMMUNITY): Payer: Self-pay | Admitting: Emergency Medicine

## 2016-12-15 DIAGNOSIS — N760 Acute vaginitis: Secondary | ICD-10-CM | POA: Insufficient documentation

## 2016-12-15 DIAGNOSIS — B9689 Other specified bacterial agents as the cause of diseases classified elsewhere: Secondary | ICD-10-CM | POA: Diagnosis not present

## 2016-12-15 HISTORY — DX: Anemia, unspecified: D64.9

## 2016-12-15 LAB — POCT URINALYSIS DIP (DEVICE)
Bilirubin Urine: NEGATIVE
Glucose, UA: NEGATIVE mg/dL
Hgb urine dipstick: NEGATIVE
KETONES UR: NEGATIVE mg/dL
Leukocytes, UA: NEGATIVE
Nitrite: NEGATIVE
PH: 5.5 (ref 5.0–8.0)
Protein, ur: NEGATIVE mg/dL
Specific Gravity, Urine: >=1.03 (ref 1.005–1.030)
Urobilinogen, UA: 0.2 mg/dL (ref 0.0–1.0)

## 2016-12-15 LAB — POCT PREGNANCY, URINE: PREG TEST UR: NEGATIVE

## 2016-12-15 MED ORDER — METRONIDAZOLE 500 MG PO TABS: 500.0000 mg | ORAL_TABLET | Freq: Two times a day (BID) | ORAL | 0 refills | Status: DC

## 2016-12-15 NOTE — Discharge Instructions (Signed)
No foreign bodies were found during the exam. He did have a discharge that most resembles bacterial vaginosis. He will be treated with Flagyl to take twice a day for 7 days. Do not drink alcohol with this. If he develops other pain or symptoms he may follow-up with your primary care doctor or return or go to [facility] if necessary. A swab to test for STDs was obtained.

## 2016-12-15 NOTE — ED Triage Notes (Signed)
Patient concerned she has a tampon in vagina for 2-3 months.  Patient has a history of this issue.  Epigastric pain, mid-lower back pain, general cramping and odor .

## 2016-12-15 NOTE — ED Provider Notes (Signed)
CSN: [id number]     Arrival date & time 12/15/16  1523 History   None    Chief Complaint  Patient presents with  . Foreign Body in Vagina   (Consider location/radiation/quality/duration/timing/severity/associated sxs/prior Treatment) 21 year old female presents to the urgent care for concern of a tampon stuck in the vagina. She has had some occasional pelvic cramping and a vaginal discharge. The discharge has been thin and gray. Denies fevers, chills, vomiting or other systemic symptoms.      Past Medical History:  Diagnosis Date  . Anemia    History reviewed. No pertinent surgical history. No family history on file. Social History  Substance Use Topics  . Smoking status: Never Smoker  . Smokeless tobacco: Not on file  . Alcohol use No   OB History    Gravida Para Term Preterm AB Living   1             SAB TAB Ectopic Multiple Live Births                 Review of Systems  Constitutional: Negative.   HENT: Negative.   Respiratory: Negative.   Gastrointestinal: Negative for abdominal distention, abdominal pain, diarrhea, nausea and vomiting.  Genitourinary: Positive for pelvic pain and vaginal discharge. Negative for vaginal bleeding.  Musculoskeletal: Negative.   Skin: Negative.   All other systems reviewed and are negative.   Allergies  Patient has no known allergies.  Home Medications   Prior to Admission medications   Medication Sig Start Date End Date Taking? Authorizing Provider  metroNIDAZOLE (FLAGYL) 500 MG tablet Take 1 tablet (500 mg total) by mouth 2 (two) times daily. X 7 days 12/15/16   Hayden Rasmussen, NP   Meds Ordered and Administered this Visit  Medications - No data to display  BP 124/80 (BP Location: Left Arm)   Pulse 80   Temp 97.8 F (36.6 C) (Oral)   Resp 18   LMP 12/13/2016   SpO2 98%  No data found.   Physical Exam  Constitutional: She is oriented to person, place, and time. She appears well-developed and well-nourished. No distress.   HENT:  Head: Normocephalic and atraumatic.  Neck: Neck supple.  Cardiovascular: Normal rate.   Pulmonary/Chest: Effort normal.  Abdominal: Soft. She exhibits no distension and no mass. There is no tenderness. There is no guarding.  Genitourinary:  Genitourinary Comments: Normal external female genitalia anatomy. There is a thin gray discharge pouring from the introitus. There is a thick white discharge pooled in the proximal vaginal vault as well as the thin gray discharge. There is no discharge from the os. The ectocervix is pink and without lesions. The vaginal vault surrounding the cervix was explored with smooth ring forceps separating the cervix from the vaginal wall 360. No foreign body was seen. No lesions or other abnormalities. There was no tenderness to the cervix. No CMT. No adnexal tenderness.  Neurological: She is alert and oriented to person, place, and time.  Skin: Skin is warm.  Psychiatric: She has a normal mood and affect.  Nursing note and vitals reviewed.   Urgent Care Course     Procedures (including critical care time)  Labs Review Labs Reviewed  POCT URINALYSIS DIP (DEVICE)  POCT PREGNANCY, URINE   Results for orders placed or performed during the hospital encounter of 12/15/16  POCT urinalysis dip (device)  Result Value Ref Range   Glucose, UA NEGATIVE NEGATIVE mg/dL   Bilirubin Urine NEGATIVE NEGATIVE   Ketones, ur  NEGATIVE NEGATIVE mg/dL   Specific Gravity, Urine >=1.030 1.005 - 1.030   Hgb urine dipstick NEGATIVE NEGATIVE   pH 5.5 5.0 - 8.0   Protein, ur NEGATIVE NEGATIVE mg/dL   Urobilinogen, UA 0.2 0.0 - 1.0 mg/dL   Nitrite NEGATIVE NEGATIVE   Leukocytes, UA NEGATIVE NEGATIVE  Pregnancy, urine POC  Result Value Ref Range   Preg Test, Ur NEGATIVE NEGATIVE     Imaging Review No results found.   Visual Acuity Review  Right Eye Distance:   Left Eye Distance:   Bilateral Distance:    Right Eye Near:   Left Eye Near:    Bilateral  Near:         MDM   1. BV (bacterial vaginosis)   Lyvia, EMT present during entire exam. No foreign bodies were found during the exam. He did have a discharge that most resembles bacterial vaginosis. He will be treated with Flagyl to take twice a day for 7 days. Do not drink alcohol with this. If he develops other pain or symptoms he may follow-up with your primary care doctor or return or go to [facility] if necessary. A swab to test for STDs was obtained. Meds ordered this encounter  Medications  . metroNIDAZOLE (FLAGYL) 500 MG tablet    Sig: Take 1 tablet (500 mg total) by mouth 2 (two) times daily. X 7 days    Dispense:  14 tablet    Refill:  0    Order Specific Question:   Supervising Provider    Answer:   Domenick Gong [[id number]]         Hayden Rasmussen, NP 12/15/16 1721

## 2016-12-16 LAB — CERVICOVAGINAL ANCILLARY ONLY
BACTERIAL VAGINITIS: POSITIVE — AB
CANDIDA VAGINITIS: NEGATIVE
CHLAMYDIA, DNA PROBE: NEGATIVE
NEISSERIA GONORRHEA: NEGATIVE
Trichomonas: NEGATIVE

## 2017-04-20 ENCOUNTER — Ambulatory Visit (INDEPENDENT_AMBULATORY_CARE_PROVIDER_SITE_OTHER): Payer: Self-pay

## 2017-04-20 ENCOUNTER — Encounter: Payer: Self-pay | Admitting: Obstetrics & Gynecology

## 2017-04-20 DIAGNOSIS — Z3201 Encounter for pregnancy test, result positive: Secondary | ICD-10-CM

## 2017-04-20 NOTE — Progress Notes (Signed)
Patient presented to office today for pregnancy test. Test confirms she is pregnant around 4 weeks base on LMP.Patient reports taking no medication at this time. She has been advised to start OTC prenatal vitamins. A pregnancy verification letter has been provider to patient so that she can apply for assistance with her DSS.

## 2017-04-21 LAB — POCT PREGNANCY, URINE: Preg Test, Ur: POSITIVE — AB

## 2017-04-28 ENCOUNTER — Encounter (HOSPITAL_COMMUNITY): Payer: Self-pay | Admitting: *Deleted

## 2017-08-24 ENCOUNTER — Encounter: Payer: Self-pay | Admitting: *Deleted

## 2017-08-24 ENCOUNTER — Emergency Department (INDEPENDENT_AMBULATORY_CARE_PROVIDER_SITE_OTHER)
Admission: EM | Admit: 2017-08-24 | Discharge: 2017-08-24 | Disposition: A | Discharge status: Home / Self Care | Payer: Self-pay | Source: Home / Self Care | Attending: Family Medicine | Admitting: Family Medicine

## 2017-08-24 ENCOUNTER — Other Ambulatory Visit: Payer: Self-pay

## 2017-08-24 DIAGNOSIS — Z202 Contact with and (suspected) exposure to infections with a predominantly sexual mode of transmission: Secondary | ICD-10-CM

## 2017-08-24 NOTE — ED Notes (Signed)
Password for labs "Donavan FoilBass". Call [phone]
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
## 2017-08-25 ENCOUNTER — Telehealth: Payer: Self-pay | Admitting: *Deleted

## 2017-08-25 ENCOUNTER — Emergency Department (INDEPENDENT_AMBULATORY_CARE_PROVIDER_SITE_OTHER)
Admission: EM | Admit: 2017-08-25 | Discharge: 2017-08-25 | Disposition: A | Discharge status: Home / Self Care | Payer: Self-pay | Source: Home / Self Care

## 2017-08-25 DIAGNOSIS — A549 Gonococcal infection, unspecified: Secondary | ICD-10-CM

## 2017-08-25 LAB — WET PREP BY MOLECULAR PROBE
Candida species: NOT DETECTED
MICRO NUMBER:: 90035471
SPECIMEN QUALITY:: ADEQUATE
Trichomonas vaginosis: NOT DETECTED

## 2017-08-25 LAB — C. TRACHOMATIS/N. GONORRHOEAE RNA
C. trachomatis RNA, TMA: NOT DETECTED
N. gonorrhoeae RNA, TMA: DETECTED — AB

## 2017-08-25 MED ORDER — METRONIDAZOLE 500 MG PO TABS: 500.0000 mg | ORAL_TABLET | Freq: Two times a day (BID) | ORAL | 0 refills | Status: AC

## 2017-08-25 MED ORDER — CEFTRIAXONE SODIUM 1 G IJ SOLR
250.0000 mg | Freq: Once | INTRAMUSCULAR | Status: AC
  Administered 2017-08-25: 250 mg via INTRAMUSCULAR

## 2017-08-25 NOTE — Telephone Encounter (Signed)
Password received. Results given and discussed. She will come in tonight for Rocephin 250 mg injection per Dr. Cathren HarshBeese. Also sent in flagyl 500mg  bid x 7 days per Dr. Cathren HarshBeese.

## 2019-06-19 ENCOUNTER — Ambulatory Visit
Admission: EM | Admit: 2019-06-19 | Discharge: 2019-06-19 | Disposition: A | Discharge status: Home / Self Care | Payer: Self-pay | Attending: Urgent Care | Admitting: Urgent Care

## 2019-06-19 ENCOUNTER — Encounter: Payer: Self-pay | Admitting: Emergency Medicine

## 2019-06-19 ENCOUNTER — Other Ambulatory Visit: Payer: Self-pay

## 2019-06-19 DIAGNOSIS — K047 Periapical abscess without sinus: Secondary | ICD-10-CM

## 2019-06-19 DIAGNOSIS — R220 Localized swelling, mass and lump, head: Secondary | ICD-10-CM

## 2019-06-19 MED ORDER — PENICILLIN V POTASSIUM 500 MG PO TABS: 500.0000 mg | ORAL_TABLET | Freq: Four times a day (QID) | ORAL | 0 refills | Status: AC

## 2019-06-19 NOTE — Discharge Instructions (Signed)
It was very nice seeing you today in clinic. Thank you for entrusting me with your care.   Take antibiotics as prescribed. Warm salt water swishes/gargles will help with pain and swelling. May use Tylenol and/or Ibuprofen as needed for pain.  Make arrangements to follow up with your a dental profession. I have given you a list of resources below. If your symptoms/condition worsens, please seek follow up care either here or in the ER. Please remember, our [facility] providers are "right here with you" when you need [place].   Again, it was my pleasure to take care of you today. Thank you for choosing our clinic. I hope that you start to feel better quickly.   Quentin Mulling, MSN, APRN, FNP-C, CEN Advanced Practice Provider [facility]  OPTIONS FOR DENTAL FOLLOW UP CARE  [place] Department of Health and Human Services - Local Safety Net Dental Clinics [email or web address]   [facility] [phone])  Sharl Ma [phone])  [place] [phone])  [facility] [phone])  [facility] [phone]) This clinic caters to the indigent population and is on a lottery system. Location: [employer] [facility], [employer], [address], [place] Clinic Hours: Wednesdays from 6pm - 9pm, patients seen by a lottery system. For dates, call or go to [email or web address] Services: Cleanings, fillings and simple extractions. Payment Options: DENTAL WORK IS FREE OF CHARGE. Bring proof of income or support. Best way to get seen: Arrive at 5:15 pm - this is a lottery, NOT first come/first serve, so arriving earlier will not increase your chances of being seen.     [facility] [phone] Select option 1 for emergencies   Location: [facility], [place], [address] [place] Clinic Hours: No walk-ins accepted - call the day before to schedule an appointment. Check in times are 9:30 am and 1:30 pm. Services: Simple extractions, temporary fillings, pulpectomy/pulp debridement, uncomplicated abscess drainage. Payment Options: PAYMENT IS DUE AT THE TIME OF SERVICE.  Fee is usually $100-200, additional surgical procedures (e.g. abscess drainage) may be extra. Cash, checks, Visa/MasterCard accepted.  Can file Medicaid if patient is covered for dental - patient should call case worker to check. No discount for [facility] patients. Best way to get seen: MUST call the day before and get onto the schedule. Can usually be seen the next 1-2 days. No walk-ins accepted.     [facility] [phone]   Location: [facility], [address], [place] Clinic Hours: M, W, Th, F 8am or 1:30pm, Tues 9a or 1:30 - first come/first served. Services: Simple extractions, temporary fillings, uncomplicated abscess drainage.  You do not need to be an [facility] resident. Payment Options: PAYMENT IS DUE AT THE TIME OF SERVICE. Dental insurance, otherwise sliding scale - bring proof of income or support. Depending on income and treatment needed, cost is usually $50-200. Best way to get seen: Arrive early as it is first come/first served.     [facility] [phone]   Location: [address] Clinic Hours: Mon-Thu 8a-5p Services: Most basic dental services including extractions and fillings. Payment Options: PAYMENT IS DUE AT THE TIME OF SERVICE. Sliding scale, up to 50% off - bring proof if income or support. Medicaid with dental option accepted. Best way to get seen: Call to schedule an appointment, can usually be seen within 2 weeks  OR they will try to see walk-ins - show up at [place] or 2p (you may have to wait).     [facility] [place]  RESIDENTS ONLY   Location: [facility], [place] [address] [place], [place] [address] Clinic Hours: By appointment only. Monday - Thursday 8am-5pm, Friday 8am-12pm Services: Cleanings, fillings, extractions. Payment Options: PAYMENT IS DUE AT THE TIME OF SERVICE. Cash, Visa or MasterCard. Sliding scale - $30 minimum per service. Best way to get seen: Come in to office, complete packet and make an appointment - need proof of income or support monies for each household member and proof of [facility] residence. Usually takes about a month to get in.     [facility] [phone]   Location: [address] [place] Clinic Hours: Walk-in Urgent Care Dental Services are offered Monday-Friday mornings only. The numbers of emergencies accepted daily is limited to the number of providers available. Maximum 15 - Mondays, Wednesdays & Thursdays Maximum 10 - Tuesdays & Fridays Services: You do not need to be a [facility] resident to be seen for a dental emergency. Emergencies are defined as pain, swelling, abnormal bleeding, or dental trauma. Walkins will receive x-rays if needed. NOTE: Dental cleaning is not an emergency. Payment Options: PAYMENT IS DUE AT THE TIME OF SERVICE. Minimum co-pay is $40.00 for uninsured patients. Minimum co-pay is $3.00 for Medicaid with dental coverage. Dental Insurance is accepted and must be presented at time of visit. Medicare does not cover dental. Forms of payment: Cash, credit card, checks. Best way to get seen: If not previously registered with the clinic, walk-in dental registration begins at 7:15 am and is on a first come/first serve basis. If previously registered with the clinic, call to make an appointment.       [facility] [phone]   Location: [place], [place] Clinic Hours: Friday mornings Services, Payment Options, Best way to get seen: Call for info

## 2019-06-19 NOTE — ED Triage Notes (Signed)
Patient stated she woke up this morning and ate breakfast and then the right side of her face started to swell. She denies pain.

## 2019-06-20 NOTE — ED Provider Notes (Signed)
[place], [place]   Name: Freddi Forster DOB: Jul 16, 1996 MRN: [id number] CSN: [id number] PCP: Donnie Coffin, MD  Arrival date and time:  06/19/19 1509  Chief Complaint:  Facial Swelling   NOTE: Prior to seeing the patient today, I have reviewed the triage nursing documentation and vital signs. Clinical staff has updated patient's PMH/PSHx, current medication list, and drug allergies/intolerances to ensure comprehensive history available to assist in medical decision making.   History:   HPI: Marcelline Spillman is a 23 y.o. female who presents today with complaints of complaints of swelling to the RIGHT side of her face. Patient with known bad tooth on her RIGHT lower jaw. She denies pain today, however notes some intermittent pain about 2 weeks ago. Patient has not experienced any fevers. Symptoms started this morning after eating cereal. Patient does not have a dental professional that she sees on a regular basis. She denies known food allergies. Patient has not experienced any shortness of breath, oropharyngeal/laryngeal fullness, or dysphagia. She has no other symptoms today.   Past Medical History:  Diagnosis Date  . Anemia     History reviewed. No pertinent surgical history.  History reviewed. No pertinent family history.  Social History   Tobacco Use  . Smoking status: Never Smoker  . Smokeless tobacco: Never Used  Substance Use Topics  . Alcohol use: Yes    Comment: 2-3 q wk  . Drug use: Not Currently    Types: Marijuana    Comment: tobacco    There are no active problems to display for this patient.   Home Medications:    No outpatient medications have been marked as taking for the 06/19/19 encounter [facility] Encounter).    Allergies:   Patient has no known allergies.  Review of Systems (ROS): Review of Systems  Constitutional: Negative for chills and fever.  HENT: Positive for dental problem and facial swelling. Negative for drooling and trouble swallowing.    Respiratory: Negative for cough, shortness of breath and wheezing.   Cardiovascular: Negative for chest pain and palpitations.  Gastrointestinal: Negative for nausea and vomiting.  Musculoskeletal: Negative for neck pain and neck stiffness.  Neurological: Negative for dizziness, numbness and headaches.  Hematological: Negative for adenopathy.  All other systems reviewed and are negative.    Vital Signs: Today's Vitals   06/19/19 1523 06/19/19 1524 06/19/19 1605  BP: (!) 137/100    Pulse: 66    Resp: 18    Temp: 98.6 F (37 C)    TempSrc: Oral    SpO2: 100%    Weight:  161 lb (73 kg)   Height:  5\' 8"  (1.727 m)   PainSc: 0-No pain  0-No pain    Physical Exam: Physical Exam  Constitutional: She is oriented to person, place, and time and well-developed, well-nourished, and in no distress.  HENT:  Head: Normocephalic and atraumatic.  Mouth/Throat: Mucous membranes are normal. Dental abscesses and dental caries present.    (+) gingival erythema and slight swelling noted. Marked tooth in ill repair.   Eyes: Pupils are equal, round, and reactive to light.  Neck: Normal range of motion. Neck supple. No tracheal deviation present.  Cardiovascular: Normal rate, regular rhythm, normal heart sounds and intact distal pulses.  Pulmonary/Chest: Effort normal and breath sounds normal. No respiratory distress.  Lymphadenopathy:    She has no cervical adenopathy.  Neurological: She is alert and oriented to person, place, and time. Gait normal.  Skin: Skin is warm and dry. No rash noted.  Psychiatric: Mood, memory, affect and judgment normal.  Nursing note and vitals reviewed.   Urgent Care Treatments / Results:   LABS: PLEASE NOTE: all labs that were ordered this encounter are listed, however only abnormal results are displayed. Labs Reviewed - No data to display  EKG: -None  RADIOLOGY: No results found.  PROCEDURES: Procedures  MEDICATIONS RECEIVED THIS VISIT: Medications  - No data to display  PERTINENT CLINICAL COURSE NOTES/UPDATES:   Initial Impression / Assessment and Plan / Urgent Care Course:  Pertinent labs & imaging results that were available during my care of the patient were personally reviewed by me and considered in my medical decision making (see lab/imaging section of note for values and interpretations).  Shakiyla Hass is a 23 y.o. female who presents to [facility] today with complaints of Facial Swelling   Patient is well appearing overall in clinic today. She does not appear to be in any acute distress. Presenting symptoms (see HPI) and exam as documented above. Swelling attributed to dental infection to the last molar of the RIGHT lower jaw. (+) swelling with no signs of systemic infection; no fevers, nausea, vomiting, or dizziness. Will cover infection with a 7 day course of oral PCN VK. Encouraged warm salt water gargles to help reduce the inflammation. Patient ultimately needs to see dentistry to discuss definitive management. She was provided with a list of dental resources on her AVS today, in additional to the business card of a local provider who is currently taking new patients. Patient was encouraged to contact someone ASAP and ask to be seen. In the interim, she was advised that she could use APAP and/or IBU as needed for any recurrent pain.   I have reviewed the follow up and strict return precautions for any new or worsening symptoms. Patient is aware of symptoms that would be deemed urgent/emergent, and would thus require further evaluation either here or in the emergency department. At the time of discharge, she verbalized understanding and consent with the discharge plan as it was reviewed with her. All questions were fielded by provider and/or clinic staff prior to patient discharge.    Final Clinical Impressions / Urgent Care Diagnoses:   Final diagnoses:  Dental infection  Facial swelling    New Prescriptions:  [place]  Controlled Substance Registry consulted? Not Applicable  Meds ordered this encounter  Medications  . penicillin v potassium (VEETID) 500 MG tablet    Sig: Take 1 tablet (500 mg total) by mouth 4 (four) times daily for 7 days.    Dispense:  28 tablet    Refill:  0    Recommended Follow up Care:  Patient encouraged to follow up with the following provider within the specified time frame, or sooner as dictated by the severity of her symptoms. As always, she was instructed that for any urgent/emergent care needs, she should seek care either here or in the emergency department for more immediate evaluation.  Follow-up Information    Need to see a dental professional - resources provided.Marland Kitchen         NOTE: This note was prepared using Scientist, clinical (histocompatibility and immunogenetics) along with smaller Lobbyist. Despite my best ability to proofread, there is the potential that transcriptional errors may still occur from this process, and are completely unintentional.    Verlee Monte, NP 06/20/19 2105

## 2019-08-23 ENCOUNTER — Ambulatory Visit
Admission: EM | Admit: 2019-08-23 | Discharge: 2019-08-23 | Disposition: A | Discharge status: Home / Self Care | Payer: Medicaid Other | Attending: Internal Medicine | Admitting: Internal Medicine

## 2019-08-23 ENCOUNTER — Encounter: Payer: Self-pay | Admitting: Emergency Medicine

## 2019-08-23 ENCOUNTER — Other Ambulatory Visit: Payer: Self-pay

## 2019-08-23 DIAGNOSIS — N760 Acute vaginitis: Secondary | ICD-10-CM | POA: Insufficient documentation

## 2019-08-23 DIAGNOSIS — B9689 Other specified bacterial agents as the cause of diseases classified elsewhere: Secondary | ICD-10-CM | POA: Insufficient documentation

## 2019-08-23 LAB — URINALYSIS, COMPLETE (UACMP) WITH MICROSCOPIC
Bacteria, UA: NONE SEEN
Bilirubin Urine: NEGATIVE
Glucose, UA: NEGATIVE mg/dL
Ketones, ur: NEGATIVE mg/dL
Leukocytes,Ua: NEGATIVE
Nitrite: NEGATIVE
Protein, ur: NEGATIVE mg/dL
Specific Gravity, Urine: >1.03 — ABNORMAL HIGH (ref 1.005–1.030)
pH: 5 (ref 5.0–8.0)

## 2019-08-23 LAB — WET PREP, GENITAL
Sperm: NONE SEEN
Trich, Wet Prep: NONE SEEN
Yeast Wet Prep HPF POC: NONE SEEN

## 2019-08-23 MED ORDER — METRONIDAZOLE 500 MG PO TABS: 500.0000 mg | ORAL_TABLET | Freq: Two times a day (BID) | ORAL | 0 refills | Status: DC

## 2019-08-23 NOTE — ED Triage Notes (Signed)
Patient here for STD testing. She states she has not been exposed to STDs but just wants to be tested.

## 2019-08-23 NOTE — ED Provider Notes (Signed)
[facility]    CSN: [id number] Arrival date & time: 08/23/19  1231      History   Chief Complaint Chief Complaint  Patient presents with  . STD testing    HPI Janice Lecker is a 24 y.o. female with no past medical history comes to urgent care with complaints of foul-smelling odor in her vagina.  Symptoms started a couple days ago.  Patient denies any vaginal discharge.  No dysuria urgency or frequency.  She douches regularly.  She has 1 regular sexual partner and engages in unprotected intercourse.  No fever or chills.  Patient will like to be evaluated for sexually transmitted disease.   HPI  Past Medical History:  Diagnosis Date  . Anemia     There are no problems to display for this patient.   History reviewed. No pertinent surgical history.  OB History    Gravida  1   Para      Term      Preterm      AB      Living        SAB      TAB      Ectopic      Multiple      Live Births               Home Medications    Prior to Admission medications   Medication Sig Start Date End Date Taking? Authorizing Provider  metroNIDAZOLE (FLAGYL) 500 MG tablet Take 1 tablet (500 mg total) by mouth 2 (two) times daily. 08/23/19   Rawan Riendeau, Britta Mccreedy, MD    Family History History reviewed. No pertinent family history.  Social History Social History   Tobacco Use  . Smoking status: Never Smoker  . Smokeless tobacco: Never Used  Substance Use Topics  . Alcohol use: Yes    Comment: 2-3 q wk  . Drug use: Not Currently    Types: Marijuana    Comment: tobacco     Allergies   Patient has no known allergies.   Review of Systems Review of Systems  Constitutional: Negative for activity change, fatigue, fever and unexpected weight change.  HENT: Negative.   Respiratory: Negative.   Gastrointestinal: Negative for abdominal pain, diarrhea, nausea and vomiting.  Genitourinary: Positive for menstrual problem. Negative for difficulty urinating,  dysuria, genital sores, urgency and vaginal discharge.  Musculoskeletal: Negative for arthralgias and myalgias.  Neurological: Negative for dizziness, weakness and headaches.  Psychiatric/Behavioral: Negative.      Physical Exam Triage Vital Signs ED Triage Vitals  Enc Vitals Group     BP 08/23/19 1310 (!) 150/100     Pulse Rate 08/23/19 1310 86     Resp 08/23/19 1310 18     Temp 08/23/19 1310 98.3 F (36.8 C)     Temp Source 08/23/19 1310 Oral     SpO2 08/23/19 1310 100 %     Weight 08/23/19 1309 151 lb (68.5 kg)     Height 08/23/19 1309 5\' 8"  (1.727 m)     Head Circumference --      Peak Flow --      Pain Score 08/23/19 1309 0     Pain Loc --      Pain Edu? --      Excl. in GC? --    No data found.  Updated Vital Signs BP (!) 150/100 (BP Location: Right Arm)   Pulse 86   Temp 98.3 F (36.8 C) (Oral)  Resp 18   Ht 5\' 8"  (1.727 m)   Wt 68.5 kg   LMP 08/23/2019   SpO2 100%   BMI 22.96 kg/m   Visual Acuity Right Eye Distance:   Left Eye Distance:   Bilateral Distance:    Right Eye Near:   Left Eye Near:    Bilateral Near:     Physical Exam Constitutional:      General: She is not in acute distress.    Appearance: Normal appearance. She is not ill-appearing.  HENT:     Right Ear: Tympanic membrane normal.     Left Ear: Tympanic membrane normal.  Cardiovascular:     Rate and Rhythm: Normal rate and regular rhythm.     Pulses: Normal pulses.     Heart sounds: Normal heart sounds.  Pulmonary:     Effort: Pulmonary effort is normal. No respiratory distress.     Breath sounds: Normal breath sounds. No stridor. No wheezing.  Abdominal:     General: Abdomen is flat. Bowel sounds are normal.     Palpations: Abdomen is soft.     Tenderness: There is no abdominal tenderness. There is no guarding or rebound.  Musculoskeletal:        General: Normal range of motion.  Skin:    General: Skin is warm.     Capillary Refill: Capillary refill takes less than 2  seconds.     Findings: No bruising or erythema.  Neurological:     Mental Status: She is alert.      UC Treatments / Results  Labs (all labs ordered are listed, but only abnormal results are displayed) Labs Reviewed  WET PREP, GENITAL - Abnormal; Notable for the following components:      Result Value   Clue Cells Wet Prep HPF POC PRESENT (*)    WBC, Wet Prep HPF POC FEW (*)    All other components within normal limits  URINALYSIS, COMPLETE (UACMP) WITH MICROSCOPIC - Abnormal; Notable for the following components:   Specific Gravity, Urine >1.030 (*)    Hgb urine dipstick SMALL (*)    All other components within normal limits  GC/CHLAMYDIA PROBE AMP    EKG   Radiology No results found.  Procedures Procedures (including critical care time)  Medications Ordered in UC Medications - No data to display  Initial Impression / Assessment and Plan / UC Course  I have reviewed the triage vital signs and the nursing notes.  Pertinent labs & imaging results that were available during my care of the patient were reviewed by me and considered in my medical decision making (see chart for details).     1.  Vaginitis (bacterial vaginosis): Wet prep is significant for clue cells consistent with bacterial vaginitis. Metronidazole 500 mg twice daily for 7 days Urinalysis is negative for leukocyte esterase Urine for GC/chlamydia sent Safe sex practices Patient is advised to quit douching.  This is most likely the cause of her recurrent bouts of bacterial vaginosis Final Clinical Impressions(s) / UC Diagnoses   Final diagnoses:  Bacterial vaginosis   Discharge Instructions   None    ED Prescriptions    Medication Sig Dispense Auth. Provider   metroNIDAZOLE (FLAGYL) 500 MG tablet Take 1 tablet (500 mg total) by mouth 2 (two) times daily. 14 tablet Rhea Kaelin, Myrene Galas, MD     PDMP not reviewed this encounter.   Chase Picket, MD 08/23/19 [phone]

## 2019-08-28 ENCOUNTER — Telehealth (HOSPITAL_COMMUNITY): Payer: Self-pay | Admitting: Emergency Medicine

## 2019-08-28 LAB — GC/CHLAMYDIA PROBE AMP

## 2019-08-28 NOTE — Telephone Encounter (Signed)
Pt contacted and made aware of situation, notified [facility] that pt will return for recollection. Pt agreeable to plan, all questions answered.

## 2019-08-28 NOTE — Telephone Encounter (Signed)
Swab states interfering substance present, unable to test. Attempted to contact pt, left message with family member to return call.

## 2019-08-30 ENCOUNTER — Ambulatory Visit
Admission: EM | Admit: 2019-08-30 | Discharge: 2019-08-30 | Disposition: A | Discharge status: Home / Self Care | Payer: Medicaid Other | Attending: Urgent Care | Admitting: Urgent Care

## 2019-08-30 DIAGNOSIS — Z113 Encounter for screening for infections with a predominantly sexual mode of transmission: Secondary | ICD-10-CM | POA: Diagnosis present

## 2019-08-30 NOTE — ED Triage Notes (Signed)
Patient in for recollection of GC/Chlamydia swab. Previous specimen was not suitable for testing.

## 2019-09-01 LAB — GC/CHLAMYDIA PROBE AMP
Chlamydia trachomatis, NAA: NEGATIVE
Neisseria Gonorrhoeae by PCR: NEGATIVE

## 2020-04-25 ENCOUNTER — Ambulatory Visit: Payer: Self-pay

## 2020-04-25 NOTE — Telephone Encounter (Signed)
Patient boyfriend, Cristina Gong, called stating that when he got out of the shower the patient his girlfriend Janice Wood was in severe upper back pain. He states that she has not injured herself, no fall. She has not been sick with cold or cough.She states that she has Hx of blood clots. She states it hurts to take a breath. Per protocol patient will be driven to nearest ER. NT offered to call 911. It was declined [place], the boyfriend, states he will call if necessary  Reason for Disposition . History of prior "blood clot" in leg or lungs (i.e., deep vein thrombosis, pulmonary embolism)  Answer Assessment - Initial Assessment Questions 1. RESPIRATORY STATUS: "Describe your breathing?" (e.g., wheezing, shortness of breath, unable to speak, severe coughing)      Breathing screeming with pain 2. ONSET: "When did this breathing problem begin?"      Sudden today 3. PATTERN "Does the difficult breathing come and go, or has it been constant since it started?"      constant 4. SEVERITY: "How bad is your breathing?" (e.g., mild, moderate, severe)    - MILD: No SOB at rest, mild SOB with walking, speaks normally in sentences, can lay down, no retractions, pulse < 100.    - MODERATE: SOB at rest, SOB with minimal exertion and prefers to sit, cannot lie down flat, speaks in phrases, mild retractions, audible wheezing, pulse 100-120.    - SEVERE: Very SOB at rest, speaks in single words, struggling to breathe, sitting hunched forward, retractions, pulse > 120      severe 5. RECURRENT SYMPTOM: "Have you had difficulty breathing before?" If Yes, ask: "When was the last time?" and "What happened that time?"     N/A 6. CARDIAC HISTORY: "Do you have any history of heart disease?" (e.g., heart attack, angina, bypass surgery, angioplasty)     N/A 7. LUNG HISTORY: "Do you have any history of lung disease?"  (e.g., pulmonary embolus, asthma, emphysema)   blod clot 8. CAUSE: "What do you think is causing the breathing  problem?"     ? 9. OTHER SYMPTOMS: "Do you have any other symptoms? (e.g., dizziness, runny nose, cough, chest pain, fever)     10. PREGNANCY: "Is there any chance you are pregnant?" "When was your last menstrual period?"      Not answered 11. TRAVEL: "Have you traveled out of the country in the last month?" (e.g., travel history, exposures)     N/A  Protocols used: BREATHING DIFFICULTY-A-AH

## 2021-02-09 ENCOUNTER — Other Ambulatory Visit: Payer: Self-pay

## 2021-02-09 ENCOUNTER — Emergency Department
Admission: EM | Admit: 2021-02-09 | Discharge: 2021-02-09 | Disposition: A | Discharge status: Home / Self Care | Payer: Medicaid Other | Attending: Emergency Medicine | Admitting: Emergency Medicine

## 2021-02-09 DIAGNOSIS — L509 Urticaria, unspecified: Secondary | ICD-10-CM

## 2021-02-09 MED ORDER — DIPHENHYDRAMINE HCL 50 MG/ML IJ SOLN
12.5000 mg | Freq: Once | INTRAMUSCULAR | Status: AC
  Administered 2021-02-09: 12.5 mg via INTRAVENOUS
  Filled 2021-02-09: qty 1

## 2021-02-09 MED ORDER — FAMOTIDINE IN NACL 20-0.9 MG/50ML-% IV SOLN
20.0000 mg | Freq: Once | INTRAVENOUS | Status: AC
  Administered 2021-02-09: 20 mg via INTRAVENOUS
  Filled 2021-02-09: qty 50

## 2021-02-09 MED ORDER — SODIUM CHLORIDE 0.9 % IV BOLUS
500.0000 mL | Freq: Once | INTRAVENOUS | Status: AC
  Administered 2021-02-09: 500 mL via INTRAVENOUS

## 2021-02-09 MED ORDER — METHYLPREDNISOLONE SODIUM SUCC 125 MG IJ SOLR
125.0000 mg | Freq: Once | INTRAMUSCULAR | Status: AC
  Administered 2021-02-09: 125 mg via INTRAVENOUS
  Filled 2021-02-09: qty 2

## 2021-02-09 MED ORDER — DIPHENHYDRAMINE HCL 50 MG/ML IJ SOLN
25.0000 mg | Freq: Once | INTRAMUSCULAR | Status: AC
  Administered 2021-02-09: 25 mg via INTRAVENOUS
  Filled 2021-02-09: qty 1

## 2021-02-09 MED ORDER — HYDROXYZINE HCL 50 MG PO TABS: 50.0000 mg | ORAL_TABLET | Freq: Three times a day (TID) | ORAL | 0 refills | Status: AC | PRN

## 2021-02-09 MED ORDER — METHYLPREDNISOLONE 4 MG PO TBPK: ORAL_TABLET | ORAL | 0 refills | Status: DC

## 2021-02-09 NOTE — ED Provider Notes (Signed)
[facility] Provider Note   ____________________________________________   Event Date/Time   First MD Initiated Contact with Patient 02/09/21 [phone]     (approximate)  I have reviewed the triage vital signs and the nursing notes.   HISTORY  Chief Complaint Urticaria    HPI Janice Wood is a 25 y.o. female patient presents 2 months of intermitting hives.  Patient states it normally affects the trunk, lower extremities and upper extremities.  Patient states she has been exposed to cats in the past 3 to 4 months.  Patient believes she was told in her childhood that she was allergic to cats.  Patient denies angioedema or dyspnea.  Patient denies any new foods, or personal hygiene and laundry products.  Patient said extensive itching with wheals.      Past Medical History:  Diagnosis Date   Anemia     There are no problems to display for this patient.   No past surgical history on file.  Prior to Admission medications   Medication Sig Start Date End Date Taking? Authorizing Provider  hydrOXYzine (ATARAX/VISTARIL) 50 MG tablet Take 1 tablet (50 mg total) by mouth 3 (three) times daily as needed. 02/09/21  Yes Joni Reining, PA-C  methylPREDNISolone (MEDROL DOSEPAK) 4 MG TBPK tablet Take Tapered dose as directed 02/09/21  Yes Joni Reining, PA-C  etonogestrel (NEXPLANON) 68 MG IMPL implant 1 each by Subdermal route once.    [provider]  metroNIDAZOLE (FLAGYL) 500 MG tablet Take 1 tablet (500 mg total) by mouth 2 (two) times daily. 08/23/19   Lamptey, Britta Mccreedy, MD    Allergies Patient has no known allergies.  No family history on file.  Social History Social History   Tobacco Use   Smoking status: Never   Smokeless tobacco: Never  Vaping Use   Vaping Use: Every day   Substances: Mixture of cannabinoids  Substance Use Topics   Alcohol use: Yes    Comment: 2-3 q wk   Drug use: Not Currently    Types:  Marijuana    Comment: tobacco    Review of Systems  Constitutional: No fever/chills Eyes: No visual changes. ENT: No sore throat. Cardiovascular: Denies chest pain. Respiratory: Denies shortness of breath. Gastrointestinal: No abdominal pain.  No nausea, no vomiting.  No diarrhea.  No constipation. Genitourinary: Negative for dysuria. Musculoskeletal: Negative for back pain. Skin: Positive for rash. Neurological: Negative for headaches, focal weakness or numbness.   ____________________________________________   PHYSICAL EXAM:  VITAL SIGNS: ED Triage Vitals [02/09/21 0906]  Enc Vitals Group     BP 124/90     Pulse Rate 67     Resp 18     Temp 98.6 F (37 C)     Temp Source Oral     SpO2      Weight      Height      Head Circumference      Peak Flow      Pain Score      Pain Loc      Pain Edu?      Excl. in GC?     Constitutional: Alert and oriented. Well appearing and in no acute distress.  Anxious. Eyes: Conjunctivae are normal. PERRL. EOMI. Head: Atraumatic. Nose: No congestion/rhinnorhea. Mouth/Throat: Mucous membranes are moist.  Oropharynx non-erythematous. Neck: No stridor.   Cardiovascular: Normal rate, regular rhythm. Grossly normal heart sounds.  Good peripheral circulation. Respiratory: Normal respiratory effort.  No retractions. Lungs  CTAB. Gastrointestinal: Soft and nontender. No distention. No abdominal bruits. No CVA tenderness. Genitourinary: Deferred Musculoskeletal: No lower extremity tenderness nor edema.  No joint effusions. Neurologic:  Normal speech and language. No gross focal neurologic deficits are appreciated. No gait instability. Skin:  Skin is warm, dry and intact.  Macular lesions trunk, upper and lower extremities  psychiatric: Mood and affect are normal. Speech and behavior are normal.  ____________________________________________   LABS (all labs ordered are listed, but only abnormal results are displayed)  Labs Reviewed -  No data to display ____________________________________________  EKG   ____________________________________________  RADIOLOGY I, Joni Reining, personally viewed and evaluated these images (plain radiographs) as part of my medical decision making, as well as reviewing the written report by the radiologist.  ED MD interpretation:    Official radiology report(s): No results found.  ____________________________________________   PROCEDURES  Procedure(s) performed (including Critical Care):  Procedures   ____________________________________________   INITIAL IMPRESSION / ASSESSMENT AND PLAN / ED COURSE  As part of my medical decision making, I reviewed the following data within the electronic MEDICAL RECORD NUMBER         Patient presents with 2 months of intermittent hives.  Patient believes is secondary to Allergies in her home.  Patient responded well to IV steroids and antihistamines.  Patient given a consult to dermatology for definitive evaluation and treatment.  Patient discharged with Atarax and Medrol Dosepak.  Return to ED if condition worsens.      ____________________________________________   FINAL CLINICAL IMPRESSION(S) / ED DIAGNOSES  Final diagnoses:  Hives of unknown origin     ED Discharge Orders          Ordered    hydrOXYzine (ATARAX/VISTARIL) 50 MG tablet  3 times daily PRN        02/09/21 1204    methylPREDNISolone (MEDROL DOSEPAK) 4 MG TBPK tablet        02/09/21 1204             Note:  This document was prepared using Dragon voice recognition software and may include unintentional dictation errors.    Joni Reining, PA-C 02/09/21 1205    Sharman Cheek, MD 02/10/21 [phone]

## 2021-02-09 NOTE — ED Notes (Signed)
Hives appear less noticeable to both this RN and pt after medication admin.

## 2021-02-09 NOTE — Discharge Instructions (Signed)
Read and follow discharge care instruction.  Try to avoid triggers for rash.  Follow-up with [facility] for definitive evaluation and treatment.  Take medication as directed.

## 2021-02-09 NOTE — ED Notes (Signed)
See triage note.   Pt states she has been taking 50mg  of benadryl BID since onset of symptoms 2 months ago. Pt denies having a PCP at this time.

## 2021-02-09 NOTE — ED Notes (Signed)
D/C and new RX discussed with pt, pt verbalized understanding. NAD noted. Pt ambulatory on D/C.

## 2021-02-09 NOTE — ED Triage Notes (Signed)
Pt states she has intermittent hives to random areas of the body for the past 2 months. Pt denies issues breathing or scratchy throats. NAD noted. VSS.

## 2021-08-03 ENCOUNTER — Ambulatory Visit
Admission: EM | Admit: 2021-08-03 | Discharge: 2021-08-03 | Disposition: A | Discharge status: Home / Self Care | Payer: 59 | Attending: Internal Medicine | Admitting: Internal Medicine

## 2021-08-03 ENCOUNTER — Encounter: Payer: Self-pay | Admitting: Emergency Medicine

## 2021-08-03 ENCOUNTER — Other Ambulatory Visit: Payer: Self-pay

## 2021-08-03 DIAGNOSIS — Z20822 Contact with and (suspected) exposure to covid-19: Secondary | ICD-10-CM | POA: Insufficient documentation

## 2021-08-03 DIAGNOSIS — J101 Influenza due to other identified influenza virus with other respiratory manifestations: Secondary | ICD-10-CM | POA: Diagnosis not present

## 2021-08-03 DIAGNOSIS — J029 Acute pharyngitis, unspecified: Secondary | ICD-10-CM | POA: Diagnosis present

## 2021-08-03 DIAGNOSIS — R051 Acute cough: Secondary | ICD-10-CM | POA: Diagnosis present

## 2021-08-03 DIAGNOSIS — J111 Influenza due to unidentified influenza virus with other respiratory manifestations: Secondary | ICD-10-CM | POA: Diagnosis not present

## 2021-08-03 LAB — RESP PANEL BY RT-PCR (FLU A&B, COVID) ARPGX2
Influenza A by PCR: POSITIVE — AB
Influenza B by PCR: NEGATIVE
SARS Coronavirus 2 by RT PCR: NEGATIVE

## 2021-08-03 MED ORDER — BENZONATATE 100 MG PO CAPS: 100.0000 mg | ORAL_CAPSULE | Freq: Three times a day (TID) | ORAL | 0 refills | Status: DC

## 2021-08-03 MED ORDER — PROMETHAZINE-DM 6.25-15 MG/5ML PO SYRP: 5.0000 mL | ORAL_SOLUTION | Freq: Four times a day (QID) | ORAL | 0 refills | Status: DC | PRN

## 2021-08-03 MED ORDER — OSELTAMIVIR PHOSPHATE 75 MG PO CAPS: 75.0000 mg | ORAL_CAPSULE | Freq: Two times a day (BID) | ORAL | 0 refills | Status: DC

## 2021-08-03 MED ORDER — LIDOCAINE VISCOUS HCL 2 % MT SOLN: 15.0000 mL | OROMUCOSAL | 0 refills | Status: DC | PRN

## 2021-08-03 MED ORDER — ACETAMINOPHEN 500 MG PO TABS
1000.0000 mg | ORAL_TABLET | Freq: Once | ORAL | Status: AC
  Administered 2021-08-03: 17:00:00 1000 mg via ORAL

## 2021-08-03 MED ORDER — CYCLOBENZAPRINE HCL 10 MG PO TABS: 10.0000 mg | ORAL_TABLET | Freq: Two times a day (BID) | ORAL | 0 refills | Status: AC | PRN

## 2021-08-03 NOTE — ED Provider Notes (Signed)
[facility]    CSN: [id number] Arrival date & time: 08/03/21  1651      History   Chief Complaint Chief Complaint  Patient presents with   Fever   Cough   Sore Throat    HPI Janice Wood is a 25 y.o. female.   Patient with chills, fever, nasal congestion, rhinorrhea, sore throat, nonproductive cough for 3 days. Known sick contacts of the flu. Has not attempted treatment of symptoms.  History of anemia.  Decreased appetite but able to tolerate food and liquids.  Denies shortness of breath, wheezing, abdominal pain, nausea, vomiting, diarrhea, headaches.  Past Medical History:  Diagnosis Date   Anemia     There are no problems to display for this patient.   History reviewed. No pertinent surgical history.  OB History     Gravida  1   Para      Term      Preterm      AB      Living         SAB      IAB      Ectopic      Multiple      Live Births               Home Medications    Prior to Admission medications   Medication Sig Start Date End Date Taking? Authorizing Provider  etonogestrel (NEXPLANON) 68 MG IMPL implant 1 each by Subdermal route once.   Yes [provider]  hydrOXYzine (ATARAX/VISTARIL) 50 MG tablet Take 1 tablet (50 mg total) by mouth 3 (three) times daily as needed. 02/09/21   Joni Reining, PA-C  methylPREDNISolone (MEDROL DOSEPAK) 4 MG TBPK tablet Take Tapered dose as directed 02/09/21   Joni Reining, PA-C  metroNIDAZOLE (FLAGYL) 500 MG tablet Take 1 tablet (500 mg total) by mouth 2 (two) times daily. 08/23/19   Lamptey, Britta Mccreedy, MD    Family History History reviewed. No pertinent family history.  Social History Social History   Tobacco Use   Smoking status: Never   Smokeless tobacco: Never  Vaping Use   Vaping Use: Every day   Substances: Mixture of cannabinoids  Substance Use Topics   Alcohol use: Yes    Comment: 2-3 q wk   Drug use: Not Currently    Types: Marijuana    Comment:  tobacco     Allergies   Patient has no known allergies.   Review of Systems Review of Systems  Constitutional:  Positive for chills and fever. Negative for activity change, appetite change, diaphoresis, fatigue and unexpected weight change.  HENT:  Positive for congestion, rhinorrhea and sore throat. Negative for dental problem, drooling, ear discharge, ear pain, facial swelling, hearing loss, mouth sores, nosebleeds, postnasal drip, sinus pressure, sinus pain, sneezing, tinnitus, trouble swallowing and voice change.   Respiratory:  Positive for cough. Negative for apnea, choking, chest tightness, shortness of breath, wheezing and stridor.   Cardiovascular: Negative.   Gastrointestinal: Negative.   Skin: Negative.   Neurological: Negative.     Physical Exam Triage Vital Signs ED Triage Vitals [08/03/21 1711]  Enc Vitals Group     BP 117/73     Pulse Rate (!) 126     Resp (!) 22     Temp 100.3 F (37.9 C)     Temp Source Oral     SpO2 100 %     Weight      Height  Head Circumference      Peak Flow      Pain Score 6     Pain Loc      Pain Edu?      Excl. in [place]?    No data found.  Updated Vital Signs BP 117/73 (BP Location: Right Arm)    Pulse (!) 126    Temp 100.3 F (37.9 C) (Oral)    Resp (!) 22    SpO2 100%   Visual Acuity Right Eye Distance:   Left Eye Distance:   Bilateral Distance:    Right Eye Near:   Left Eye Near:    Bilateral Near:     Physical Exam Constitutional:      Appearance: Normal appearance. She is normal weight.  HENT:     Head: Normocephalic.     Right Ear: Tympanic membrane, ear canal and external ear normal.     Left Ear: Tympanic membrane, ear canal and external ear normal.     Nose: Congestion and rhinorrhea present.     Mouth/Throat:     Mouth: Mucous membranes are moist.     Pharynx: Posterior oropharyngeal erythema present.  Eyes:     Extraocular Movements: Extraocular movements intact.  Cardiovascular:     Rate and  Rhythm: Regular rhythm. Tachycardia present.     Pulses: Normal pulses.     Heart sounds: Normal heart sounds.  Pulmonary:     Effort: Pulmonary effort is normal.     Breath sounds: Normal breath sounds.  Musculoskeletal:     Cervical back: Normal range of motion and neck supple.  Skin:    General: Skin is warm and dry.  Neurological:     General: No focal deficit present.     Mental Status: She is alert and oriented to person, place, and time. Mental status is at baseline.  Psychiatric:        Mood and Affect: Mood normal.        Behavior: Behavior normal.     UC Treatments / Results  Labs (all labs ordered are listed, but only abnormal results are displayed) Labs Reviewed - No data to display  EKG   Radiology No results found.  Procedures Procedures (including critical care time)  Medications Ordered in UC Medications  acetaminophen (TYLENOL) tablet 1,000 mg (1,000 mg Oral Given 08/03/21 1717)    Initial Impression / Assessment and Plan / UC Course  I have reviewed the triage vital signs and the nursing notes.  Pertinent labs & imaging results that were available during my care of the patient were reviewed by me and considered in my medical decision making (see chart for details).  Influenza-like illness  1.  Tamiflu 75 mg twice daily for 5 days 2.  Tessalon 100 mg 3 times daily as needed 3.  Promethazine DM 6.25-15 mg / 5 mL every 6 hours as needed 4.  Lidocaine viscous 2% 15 mils every 4 hours as needed 5.  Flexeril 10 mg twice daily as needed 6.  Urgent care follow-up as needed 7.  Note given Final Clinical Impressions(s) / UC Diagnoses   Final diagnoses:  None   Discharge Instructions   None    ED Prescriptions   None    PDMP not reviewed this encounter.   Hans Eden, [place] 08/03/21 [phone]

## 2021-08-03 NOTE — ED Triage Notes (Signed)
Pt presents today with c/o of fever, cough, sore throat and body aches that began x 3 days. Pain 6/10. No meds pta.

## 2021-08-03 NOTE — Discharge Instructions (Addendum)
They are most likely being caused by influenza A, your symptoms are consistent with the current presentation, influenza is a virus and will steadily improve with time  You may take Tamiflu twice daily for the next 5 days, if you are able to get this medication from the pharmacy then continue supportive treatment, symptoms will improve as the virus works as well after system whether or not you take this medication    You can take Tylenol 1000 mg every 6 hours and/or Ibuprofen 800 mg every 8 hours  as needed for fever reduction and pain relief.   For cough: honey 1/2 to 1 teaspoon (you can dilute the honey in water or another fluid).  You can also use guaifenesin and dextromethorphan for cough. You can use a humidifier for chest congestion and cough.  If you don't have a humidifier, you can sit in the bathroom with the hot shower running.      For sore throat: try warm salt water gargles, cepacol lozenges, throat spray, warm tea or water with lemon/honey, popsicles or ice, or OTC cold relief medicine for throat discomfort.   For congestion: take a daily anti-histamine like Zyrtec, Claritin, and a oral decongestant, such as pseudoephedrine.  You can also use Flonase 1-2 sprays in each nostril daily.   It is important to stay hydrated: drink plenty of fluids (water, gatorade/powerade/pedialyte, juices, or teas) to keep your throat moisturized and help further relieve irritation/discomfort.

## 2021-08-07 ENCOUNTER — Ambulatory Visit: Payer: Self-pay | Admitting: *Deleted

## 2021-08-07 NOTE — Telephone Encounter (Signed)
°  Chief Complaint: flu Symptoms: fever,vomiting, cough, headache, congestion Frequency: 12/15 Pertinent Negatives: Patient denies SOB Disposition: [] ED /[x] Urgent Care (no appt availability in office) / [] Appointment(In office/virtual)/ []  [facility]/ [] Home Care/ [] Refused Recommended Disposition  Additional Notes: [facility] was unable to contact patient with results- flu +, Patient is not better and could not afford her medications. Patient advised go back if not better.   Summary: test results from urgent care.   Pt called back asking for results from her flu and covid test on the 19th when she went to [facility].  She said she did not get any of the medications that the Dr. there prescribed.  She would like a nurse to call her back.  She does not have a primary care with [facility],  She still has a fever and flu like symptoms.   CB#  [phone]      Reason for Disposition  Fever present > 3 days (72 hours)  Answer Assessment - Initial Assessment Questions 1. WORST SYMPTOM: "What is your worst symptom?" (e.g., cough, runny nose, muscle aches, headache, sore throat, fever)      Fatigue-  muscle aches 2. ONSET: "When did your flu symptoms start?"      12/15 3. COUGH: "How bad is the cough?"       Was better- using Dayquil cold/flu day/night 4. RESPIRATORY DISTRESS: "Describe your breathing."      Normal- no SOB 5. FEVER: "Do you have a fever?" If Yes, ask: "What is your temperature, how was it measured, and when did it start?"     102- yesterday 6. EXPOSURE: "Were you exposed to someone with influenza?"       Boyfriend has it- undiagnosed 7. FLU VACCINE: "Did you get a flu shot this year?"     no 8. HIGH RISK DISEASE: "Do you have any chronic medical problems?" (e.g., heart or lung disease, asthma, weak immune system, or other HIGH RISK conditions)     no 9. PREGNANCY: "Is there any chance you are pregnant?" "When was your last menstrual period?"     No-LMP due  now 10. OTHER SYMPTOMS: "Do you have any other symptoms?"  (e.g., runny nose, muscle aches, headache, sore throat)       Congestion-chest  Protocols used: Influenza - [facility]

## 2022-03-10 ENCOUNTER — Emergency Department
Admission: EM | Admit: 2022-03-10 | Discharge: 2022-03-10 | Disposition: A | Discharge status: Home / Self Care | Payer: 59 | Attending: Emergency Medicine | Admitting: Emergency Medicine

## 2022-03-10 ENCOUNTER — Other Ambulatory Visit: Payer: Self-pay

## 2022-03-10 ENCOUNTER — Emergency Department: Payer: 59

## 2022-03-10 ENCOUNTER — Encounter: Payer: Self-pay | Admitting: Emergency Medicine

## 2022-03-10 DIAGNOSIS — Z202 Contact with and (suspected) exposure to infections with a predominantly sexual mode of transmission: Secondary | ICD-10-CM | POA: Insufficient documentation

## 2022-03-10 DIAGNOSIS — S60222A Contusion of left hand, initial encounter: Secondary | ICD-10-CM | POA: Insufficient documentation

## 2022-03-10 MED ORDER — MELOXICAM 15 MG PO TABS: 15.0000 mg | ORAL_TABLET | Freq: Every day | ORAL | 0 refills | Status: AC
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
## 2022-03-10 NOTE — ED Notes (Signed)
2 SST tops sent to lab at this time. Pt states she wanted to be tested for HIV also.

## 2022-04-16 ENCOUNTER — Ambulatory Visit: Payer: Self-pay

## 2022-04-16 ENCOUNTER — Ambulatory Visit: Payer: Medicaid Other

## 2022-04-22 ENCOUNTER — Encounter: Payer: Self-pay | Admitting: Emergency Medicine

## 2022-04-22 ENCOUNTER — Emergency Department: Payer: Self-pay

## 2022-04-22 ENCOUNTER — Emergency Department
Admission: EM | Admit: 2022-04-22 | Discharge: 2022-04-22 | Disposition: A | Discharge status: Home / Self Care | Payer: Self-pay | Attending: Emergency Medicine | Admitting: Emergency Medicine

## 2022-04-22 DIAGNOSIS — S70211A Abrasion, right hip, initial encounter: Secondary | ICD-10-CM | POA: Insufficient documentation

## 2022-04-22 DIAGNOSIS — S0033XA Contusion of nose, initial encounter: Secondary | ICD-10-CM | POA: Insufficient documentation

## 2022-04-22 DIAGNOSIS — Y906 Blood alcohol level of 120-199 mg/100 ml: Secondary | ICD-10-CM | POA: Insufficient documentation

## 2022-04-22 DIAGNOSIS — F1010 Alcohol abuse, uncomplicated: Secondary | ICD-10-CM | POA: Insufficient documentation

## 2022-04-22 DIAGNOSIS — R000 Tachycardia, unspecified: Secondary | ICD-10-CM | POA: Insufficient documentation

## 2022-04-22 DIAGNOSIS — S20319A Abrasion of unspecified front wall of thorax, initial encounter: Secondary | ICD-10-CM | POA: Insufficient documentation

## 2022-04-22 DIAGNOSIS — T07XXXA Unspecified multiple injuries, initial encounter: Secondary | ICD-10-CM

## 2022-04-22 DIAGNOSIS — Y92410 Unspecified street and highway as the place of occurrence of the external cause: Secondary | ICD-10-CM | POA: Diagnosis not present

## 2022-04-22 DIAGNOSIS — S0992XA Unspecified injury of nose, initial encounter: Secondary | ICD-10-CM | POA: Diagnosis present

## 2022-04-22 LAB — COMPREHENSIVE METABOLIC PANEL
ALT: 14 U/L (ref 0–44)
AST: 28 U/L (ref 15–41)
Albumin: 4.6 g/dL (ref 3.5–5.0)
Alkaline Phosphatase: 51 U/L (ref 38–126)
Anion gap: 10 (ref 5–15)
BUN: 6 mg/dL (ref 6–20)
CO2: 23 mmol/L (ref 22–32)
Calcium: 9.1 mg/dL (ref 8.9–10.3)
Chloride: 108 mmol/L (ref 98–111)
Creatinine, Ser: 0.74 mg/dL (ref 0.44–1.00)
GFR, Estimated: >60 mL/min (ref >60)
Glucose, Bld: 106 mg/dL — ABNORMAL HIGH (ref 70–99)
Potassium: 4 mmol/L (ref 3.5–5.1)
Sodium: 141 mmol/L (ref 135–145)
Total Bilirubin: 0.6 mg/dL (ref 0.3–1.2)
Total Protein: 8.6 g/dL — ABNORMAL HIGH (ref 6.5–8.1)

## 2022-04-22 LAB — CBC WITH DIFFERENTIAL/PLATELET
Abs Immature Granulocytes: 0.02 10*3/uL (ref 0.00–0.07)
Basophils Absolute: 0.1 10*3/uL (ref 0.0–0.1)
Basophils Relative: 1 %
Eosinophils Absolute: 0.1 10*3/uL (ref 0.0–0.5)
Eosinophils Relative: 2 %
HCT: 41.6 % (ref 36.0–46.0)
Hemoglobin: 14.1 g/dL (ref 12.0–15.0)
Immature Granulocytes: 0 %
Lymphocytes Relative: 33 %
Lymphs Abs: 2.2 10*3/uL (ref 0.7–4.0)
MCH: 31 pg (ref 26.0–34.0)
MCHC: 33.9 g/dL (ref 30.0–36.0)
MCV: 91.4 fL (ref 80.0–100.0)
Monocytes Absolute: 0.4 10*3/uL (ref 0.1–1.0)
Monocytes Relative: 6 %
Neutro Abs: 4 10*3/uL (ref 1.7–7.7)
Neutrophils Relative %: 58 %
Platelets: 402 10*3/uL — ABNORMAL HIGH (ref 150–400)
RBC: 4.55 MIL/uL (ref 3.87–5.11)
RDW: 11.9 % (ref 11.5–15.5)
WBC: 6.8 10*3/uL (ref 4.0–10.5)
nRBC: 0 % (ref 0.0–0.2)

## 2022-04-22 LAB — ETHANOL: Alcohol, Ethyl (B): 146 mg/dL — ABNORMAL HIGH (ref <10)

## 2022-04-22 LAB — HCG, QUANTITATIVE, PREGNANCY: hCG, Beta Chain, Quant, S: <1 m[IU]/mL (ref <5)

## 2022-04-22 MED ORDER — METHOCARBAMOL 750 MG PO TABS: 750.0000 mg | ORAL_TABLET | Freq: Three times a day (TID) | ORAL | 0 refills | Status: AC

## 2022-04-22 MED ORDER — MORPHINE SULFATE (PF) 4 MG/ML IV SOLN
4.0000 mg | Freq: Once | INTRAVENOUS | Status: AC
  Administered 2022-04-22: 4 mg via INTRAVENOUS
  Filled 2022-04-22: qty 1

## 2022-04-22 MED ORDER — IOHEXOL 300 MG/ML  SOLN
100.0000 mL | Freq: Once | INTRAMUSCULAR | Status: AC | PRN
  Administered 2022-04-22: 100 mL via INTRAVENOUS

## 2022-04-22 MED ORDER — ONDANSETRON HCL 4 MG/2ML IJ SOLN
4.0000 mg | Freq: Once | INTRAMUSCULAR | Status: AC
  Administered 2022-04-22: 4 mg via INTRAVENOUS
  Filled 2022-04-22: qty 2

## 2022-04-22 MED ORDER — SODIUM CHLORIDE 0.9 % IV BOLUS (SEPSIS)
1000.0000 mL | Freq: Once | INTRAVENOUS | Status: AC
  Administered 2022-04-22: 1000 mL via INTRAVENOUS

## 2022-04-22 MED ORDER — TETANUS-DIPHTH-ACELL PERTUSSIS 5-2.5-18.5 LF-MCG/0.5 IM SUSY: 0.5000 mL | PREFILLED_SYRINGE | Freq: Once | INTRAMUSCULAR | Status: DC
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
## 2022-04-22 NOTE — ED Notes (Signed)
Sister in room as well as MD to help explain need for pt treatment and discuss plan of care as well as pts right to refusal.

## 2022-04-22 NOTE — ED Notes (Signed)
While attempting to wheel pt back to room, pt on phone requesting to be picked up. Pt in room and asked to transfer to bed for treatment and pt refusing and sts, "Man hold up a second. I aint tryin to stay here I get my ride coming." Pt asked if she wanted treatment or if she wanted to leave and pt sts I aint staying, Oh my God bruh, [employer] outside of room to inform pt that if she leaves that she will be detained. Pt informed by staff that she is not be detained to the ED and that if she wants to leave, she has the right. Pt yelling at officer and MD in room to assist in calming pt.   Once pt calmed, pt still unknown if she wants treatment and request sister to come back to room. MD provided education to pt on risk of leaving against medical advice and confirms pt A&O x4 prior to education. Pt provides verbal understanding back to MD with writer at bedside for witness.

## 2022-04-26 ENCOUNTER — Ambulatory Visit: Payer: Medicaid Other

## 2024-08-08 ENCOUNTER — Ambulatory Visit
Admission: EM | Admit: 2024-08-08 | Discharge: 2024-08-08 | Disposition: A | Discharge status: Home / Self Care | Attending: Family Medicine | Admitting: Family Medicine

## 2024-08-08 ENCOUNTER — Ambulatory Visit

## 2024-08-08 DIAGNOSIS — F17290 Nicotine dependence, other tobacco product, uncomplicated: Secondary | ICD-10-CM

## 2024-08-08 DIAGNOSIS — J40 Bronchitis, not specified as acute or chronic: Secondary | ICD-10-CM

## 2024-08-08 DIAGNOSIS — R051 Acute cough: Secondary | ICD-10-CM | POA: Diagnosis not present

## 2024-08-08 MED ORDER — AZITHROMYCIN 250 MG PO TABS: ORAL_TABLET | ORAL | 0 refills | Status: AC

## 2024-08-08 MED ORDER — ALBUTEROL SULFATE HFA 108 (90 BASE) MCG/ACT IN AERS: 2.0000 | INHALATION_SPRAY | RESPIRATORY_TRACT | 0 refills | Status: AC | PRN

## 2024-08-08 MED ORDER — PREDNISONE 10 MG (21) PO TBPK: ORAL_TABLET | Freq: Every day | ORAL | 0 refills | Status: AC

## 2024-08-08 MED ORDER — AZITHROMYCIN 250 MG PO TABS: ORAL_TABLET | ORAL | 0 refills | Status: DC

## 2024-08-08 MED ORDER — ALBUTEROL SULFATE HFA 108 (90 BASE) MCG/ACT IN AERS: 2.0000 | INHALATION_SPRAY | RESPIRATORY_TRACT | 0 refills | Status: DC | PRN

## 2024-08-08 MED ORDER — PREDNISONE 10 MG (21) PO TBPK: ORAL_TABLET | Freq: Every day | ORAL | 0 refills | Status: DC

## 2024-08-08 NOTE — ED Triage Notes (Signed)
 Patient present to UC for flu-like symptoms. Reports productive cough, chest tightness x 2 weeks. Treating with Theraflu with no improvement.   Denies fever or SOB.

## 2024-08-08 NOTE — Discharge Instructions (Addendum)
 Your chest xray didn't show a pneumonia.  Continue working on cutting back on your cigar use.    Stop by the pharmacy to pick up your prescriptions.  Follow up with your primary care provider or return to the urgent care, if not improving.

## 2024-08-08 NOTE — ED Provider Notes (Signed)
 " [facility]    CSN: [id number] Arrival date & time: 08/08/24  1536      History   Chief Complaint Chief Complaint  Patient presents with   Cough    HPI [name] is a 28 y.o. female.   HPI  History obtained from the patient. [name] presents for productive cough for the past 2 weeks.  Feels like her chest is tight. No shortness of breath. Endorses fever but this has resolved with Theraflu and Tylenol .  Nothing is helping the cough.    She smokes cigars. Denies hx of asthma.  She has been using     Past Medical History:  Diagnosis Date   Anemia     There are no active problems to display for this patient.   History reviewed. No pertinent surgical history.  OB History     Gravida  1   Para      Term      Preterm      AB      Living         SAB      IAB      Ectopic      Multiple      Live Births               Home Medications    Prior to Admission medications  Medication Sig Start Date End Date Taking? Authorizing Provider  albuterol  (VENTOLIN  HFA) 108 (90 Base) MCG/ACT inhaler Inhale 2 puffs into the lungs every 4 (four) hours as needed. 08/08/24   Monico Sudduth, DO  azithromycin  (ZITHROMAX  Z-PAK) 250 MG tablet Take 2 tablets on day 1 then 1 tablet daily 08/08/24   Thales Knipple, DO  cyclobenzaprine  (FLEXERIL ) 10 MG tablet Take 1 tablet (10 mg total) by mouth 2 (two) times daily as needed for muscle spasms. 08/03/21   Teresa Shelba SAUNDERS, NP  etonogestrel (NEXPLANON) 68 MG IMPL implant 1 each by Subdermal route once.    [provider]  hydrOXYzine  (ATARAX /VISTARIL ) 50 MG tablet Take 1 tablet (50 mg total) by mouth 3 (three) times daily as needed. 02/09/21   Claudene Tanda POUR, PA-C  predniSONE  (STERAPRED UNI-PAK 21 TAB) 10 MG (21) TBPK tablet Take by mouth daily. Take 6 tabs by mouth daily for 1, then 5 tabs for 1 day, then 4 tabs for 1 day, then 3 tabs for 1 day, then 2 tabs for 1 day, then 1 tab for 1 day.  08/08/24   Kateryna Grantham, DO    Family History History reviewed. No pertinent family history.  Social History Social History[1]   Allergies   Patient has no known allergies.   Review of Systems Review of Systems: negative unless otherwise stated in HPI.      Physical Exam Triage Vital Signs ED Triage Vitals  Encounter Vitals Group     BP 08/08/24 1607 118/89     Girls Systolic BP Percentile --      Girls Diastolic BP Percentile --      Boys Systolic BP Percentile --      Boys Diastolic BP Percentile --      Pulse Rate 08/08/24 1607 84     Resp 08/08/24 1607 18     Temp 08/08/24 1607 98.5 F (36.9 C)     Temp Source 08/08/24 1607 Oral     SpO2 08/08/24 1607 98 %     Weight --      Height --  Head Circumference --      Peak Flow --      Pain Score 08/08/24 1606 0     Pain Loc --      Pain Education --      Exclude from Growth Chart --    No data found.  Updated Vital Signs BP 118/89 (BP Location: Left Arm)   Pulse 84   Temp 98.5 F (36.9 C) (Oral)   Resp 18   SpO2 98%   Visual Acuity Right Eye Distance:   Left Eye Distance:   Bilateral Distance:    Right Eye Near:   Left Eye Near:    Bilateral Near:     Physical Exam GEN:     alert, non-toxic appearing female in no distress    HENT:  mucus membranes moist, no  nasal discharge EYES:   no scleral injection or discharge RESP:  no increased work of breathing, rhonchi bilaterally, no wheezing CVS:   regular rate and rhythm Skin:   warm and dry    UC Treatments / Results  Labs (all labs ordered are listed, but only abnormal results are displayed) Labs Reviewed - No data to display  EKG   Radiology DG Chest 2 View Result Date: 08/08/2024 CLINICAL DATA:  chest congestion and cough for 2 weeks EXAM: CHEST - 2 VIEW COMPARISON:  April 22, 2022 FINDINGS: The cardiomediastinal silhouette is normal in contour. No pleural effusion. No pneumothorax. No acute pleuroparenchymal abnormality.  Visualized abdomen is unremarkable. No acute osseous abnormality noted. IMPRESSION: No acute cardiopulmonary abnormality. Electronically Signed   By: Corean Salter M.D.   On: 08/08/2024 16:44      Procedures Procedures (including critical care time)  Medications Ordered in UC Medications - No data to display  Initial Impression / Assessment and Plan / UC Course  I have reviewed the triage vital signs and the nursing notes.  Pertinent labs & imaging results that were available during my care of the patient were reviewed by me and considered in my medical decision making (see chart for details).       Pt is a 28 y.o. female who presents for 2 weeks of cough that is not improving.  [name] is  afebrile here without recent antipyretics. Satting well on room air. Overall pt is  non-toxic appearing, well hydrated, without respiratory distress. Pulmonary exam is remarkable for rhonchi that clear with cough.  After shared decision making, we will pursue chest x-ray.  COVID  and influenza testing deferred due to length of symptoms.   Chest xray personally reviewed by me without focal pneumonia, pleural effusion, cardiomegaly or pneumothorax.  Radiology impression reviewed  Treat acute bronchitis with steroids and antibiotics as below.  Albuterol  inhaler for bronchospasm.  Typical duration of symptoms discussed. Return and ED precautions given and patient voiced understanding.   Discussed MDM, treatment plan and plan for follow-up with patient who agrees with plan.       Final Clinical Impressions(s) / UC Diagnoses   Final diagnoses:  Acute cough  Cigar smoker  Bronchitis     Discharge Instructions      Your chest xray didn't show a pneumonia.  Continue working on cutting back on your cigar use.    Stop by the pharmacy to pick up your prescriptions.  Follow up with your primary care provider or return to the urgent care, if not improving.       ED Prescriptions      Medication Sig Dispense Auth. Provider  albuterol  (VENTOLIN  HFA) 108 (90 Base) MCG/ACT inhaler  (Status: Discontinued) Inhale 2 puffs into the lungs every 4 (four) hours as needed. 6.7 g Daryan Buell, DO   azithromycin  (ZITHROMAX  Z-PAK) 250 MG tablet  (Status: Discontinued) Take 2 tablets on day 1 then 1 tablet daily 6 tablet Tonesha Tsou, DO   predniSONE  (STERAPRED UNI-PAK 21 TAB) 10 MG (21) TBPK tablet  (Status: Discontinued) Take by mouth daily. Take 6 tabs by mouth daily for 1, then 5 tabs for 1 day, then 4 tabs for 1 day, then 3 tabs for 1 day, then 2 tabs for 1 day, then 1 tab for 1 day. 21 tablet Aria Jarrard, DO   albuterol  (VENTOLIN  HFA) 108 (90 Base) MCG/ACT inhaler Inhale 2 puffs into the lungs every 4 (four) hours as needed. 6.7 g Spurgeon Gancarz, DO   azithromycin  (ZITHROMAX  Z-PAK) 250 MG tablet Take 2 tablets on day 1 then 1 tablet daily 6 tablet Asja Frommer, DO   predniSONE  (STERAPRED UNI-PAK 21 TAB) 10 MG (21) TBPK tablet Take by mouth daily. Take 6 tabs by mouth daily for 1, then 5 tabs for 1 day, then 4 tabs for 1 day, then 3 tabs for 1 day, then 2 tabs for 1 day, then 1 tab for 1 day. 21 tablet Yadiel Aubry, DO      PDMP not reviewed this encounter.     [1]  Social History Tobacco Use   Smoking status: Some Days    Types: Cigars   Smokeless tobacco: Never  Vaping Use   Vaping status: Some Days   Substances: Mixture of cannabinoids  Substance Use Topics   Alcohol use: Yes    Comment: 2-3 q wk   Drug use: Not Currently    Types: Marijuana    Comment: tobacco     Kriste Berth, DO 08/22/24 1227  "
